# Patient Record
Sex: Female | Born: 1955 | Race: White | Hispanic: No | Marital: Married | Smoking: Former smoker
Health system: Southern US, Community
[De-identification: ages and names within clinical notes are randomized; demographics above are authoritative.]

## PROBLEM LIST (undated history)

## (undated) DIAGNOSIS — J449 Chronic obstructive pulmonary disease, unspecified: Secondary | ICD-10-CM

---

## 2016-08-23 ENCOUNTER — Inpatient Hospital Stay: Payer: BC Managed Care – PPO

## 2016-08-23 ENCOUNTER — Inpatient Hospital Stay
Admission: EM | Admit: 2016-08-23 | Discharge: 2016-08-26 | DRG: 871 | Disposition: A | Discharge status: Home / Self Care | Payer: BC Managed Care – PPO | Attending: Internal Medicine | Admitting: Internal Medicine

## 2016-08-23 ENCOUNTER — Emergency Department: Payer: BC Managed Care – PPO

## 2016-08-23 ENCOUNTER — Encounter: Payer: Self-pay | Admitting: Emergency Medicine

## 2016-08-23 DIAGNOSIS — J440 Chronic obstructive pulmonary disease with acute lower respiratory infection: Secondary | ICD-10-CM | POA: Diagnosis present

## 2016-08-23 DIAGNOSIS — R0602 Shortness of breath: Secondary | ICD-10-CM | POA: Diagnosis not present

## 2016-08-23 DIAGNOSIS — R531 Weakness: Secondary | ICD-10-CM | POA: Diagnosis present

## 2016-08-23 DIAGNOSIS — J9621 Acute and chronic respiratory failure with hypoxia: Secondary | ICD-10-CM | POA: Diagnosis present

## 2016-08-23 DIAGNOSIS — Z87891 Personal history of nicotine dependence: Secondary | ICD-10-CM

## 2016-08-23 DIAGNOSIS — J189 Pneumonia, unspecified organism: Secondary | ICD-10-CM | POA: Diagnosis present

## 2016-08-23 DIAGNOSIS — R748 Abnormal levels of other serum enzymes: Secondary | ICD-10-CM | POA: Diagnosis present

## 2016-08-23 DIAGNOSIS — Z681 Body mass index (BMI) 19 or less, adult: Secondary | ICD-10-CM

## 2016-08-23 DIAGNOSIS — E43 Unspecified severe protein-calorie malnutrition: Secondary | ICD-10-CM | POA: Diagnosis present

## 2016-08-23 DIAGNOSIS — J984 Other disorders of lung: Secondary | ICD-10-CM | POA: Diagnosis present

## 2016-08-23 DIAGNOSIS — F17200 Nicotine dependence, unspecified, uncomplicated: Secondary | ICD-10-CM | POA: Diagnosis present

## 2016-08-23 DIAGNOSIS — E876 Hypokalemia: Secondary | ICD-10-CM

## 2016-08-23 DIAGNOSIS — R911 Solitary pulmonary nodule: Secondary | ICD-10-CM | POA: Diagnosis present

## 2016-08-23 DIAGNOSIS — Y95 Nosocomial condition: Secondary | ICD-10-CM | POA: Diagnosis present

## 2016-08-23 DIAGNOSIS — D473 Essential (hemorrhagic) thrombocythemia: Secondary | ICD-10-CM | POA: Diagnosis present

## 2016-08-23 DIAGNOSIS — R0781 Pleurodynia: Secondary | ICD-10-CM

## 2016-08-23 DIAGNOSIS — I214 Non-ST elevation (NSTEMI) myocardial infarction: Secondary | ICD-10-CM

## 2016-08-23 DIAGNOSIS — A419 Sepsis, unspecified organism: Principal | ICD-10-CM | POA: Diagnosis present

## 2016-08-23 DIAGNOSIS — J439 Emphysema, unspecified: Secondary | ICD-10-CM

## 2016-08-23 DIAGNOSIS — J441 Chronic obstructive pulmonary disease with (acute) exacerbation: Secondary | ICD-10-CM | POA: Diagnosis present

## 2016-08-23 DIAGNOSIS — J181 Lobar pneumonia, unspecified organism: Secondary | ICD-10-CM

## 2016-08-23 DIAGNOSIS — D72829 Elevated white blood cell count, unspecified: Secondary | ICD-10-CM

## 2016-08-23 HISTORY — DX: Chronic obstructive pulmonary disease, unspecified: J44.9

## 2016-08-23 LAB — CBC WITH DIFFERENTIAL/PLATELET
BASOS ABS: 0.1 10*3/uL (ref 0–0.1)
BASOS PCT: 1 %
EOS ABS: 0 10*3/uL (ref 0–0.7)
EOS PCT: 0 %
HCT: 42.1 % (ref 35.0–47.0)
HEMOGLOBIN: 14.4 g/dL (ref 12.0–16.0)
Lymphocytes Relative: 5 %
Lymphs Abs: 0.8 10*3/uL — ABNORMAL LOW (ref 1.0–3.6)
MCH: 28.3 pg (ref 26.0–34.0)
MCHC: 34.2 g/dL (ref 32.0–36.0)
MCV: 82.8 fL (ref 80.0–100.0)
Monocytes Absolute: 0.7 10*3/uL (ref 0.2–0.9)
Monocytes Relative: 4 %
NEUTROS PCT: 90 %
Neutro Abs: 15.6 10*3/uL — ABNORMAL HIGH (ref 1.4–6.5)
PLATELETS: 708 10*3/uL — AB (ref 150–440)
RBC: 5.09 MIL/uL (ref 3.80–5.20)
RDW: 13.5 % (ref 11.5–14.5)
WBC: 17.1 10*3/uL — AB (ref 3.6–11.0)

## 2016-08-23 LAB — URINALYSIS, ROUTINE W REFLEX MICROSCOPIC
Bacteria, UA: NONE SEEN
Bilirubin Urine: NEGATIVE
GLUCOSE, UA: 50 mg/dL — AB
Ketones, ur: NEGATIVE mg/dL
Leukocytes, UA: NEGATIVE
NITRITE: NEGATIVE
PROTEIN: 30 mg/dL — AB
SPECIFIC GRAVITY, URINE: 1.017 (ref 1.005–1.030)
pH: 6 (ref 5.0–8.0)

## 2016-08-23 LAB — PROCALCITONIN: PROCALCITONIN: 0.13 ng/mL

## 2016-08-23 LAB — COMPREHENSIVE METABOLIC PANEL
ALBUMIN: 3 g/dL — AB (ref 3.5–5.0)
ALT: 17 U/L (ref 14–54)
ANION GAP: 15 (ref 5–15)
AST: 32 U/L (ref 15–41)
Alkaline Phosphatase: 175 U/L — ABNORMAL HIGH (ref 38–126)
BILIRUBIN TOTAL: 0.6 mg/dL (ref 0.3–1.2)
BUN: 12 mg/dL (ref 6–20)
CALCIUM: 9 mg/dL (ref 8.9–10.3)
CO2: 31 mmol/L (ref 22–32)
Chloride: 92 mmol/L — ABNORMAL LOW (ref 101–111)
Creatinine, Ser: 0.52 mg/dL (ref 0.44–1.00)
GFR calc non Af Amer: >60 mL/min (ref >60)
GLUCOSE: 143 mg/dL — AB (ref 65–99)
POTASSIUM: 2.8 mmol/L — AB (ref 3.5–5.1)
Sodium: 138 mmol/L (ref 135–145)
TOTAL PROTEIN: 8.2 g/dL — AB (ref 6.5–8.1)

## 2016-08-23 LAB — INFLUENZA PANEL BY PCR (TYPE A & B)
INFLBPCR: NEGATIVE
Influenza A By PCR: NEGATIVE

## 2016-08-23 LAB — LACTIC ACID, PLASMA
LACTIC ACID, VENOUS: 2.5 mmol/L — AB (ref 0.5–1.9)
Lactic Acid, Venous: 1.8 mmol/L (ref 0.5–1.9)

## 2016-08-23 LAB — POTASSIUM: Potassium: 3.4 mmol/L — ABNORMAL LOW (ref 3.5–5.1)

## 2016-08-23 LAB — TROPONIN I: TROPONIN I: 0.17 ng/mL — AB (ref <0.03)

## 2016-08-23 LAB — BRAIN NATRIURETIC PEPTIDE: B Natriuretic Peptide: 572 pg/mL — ABNORMAL HIGH (ref 0.0–100.0)

## 2016-08-23 LAB — PROTIME-INR
INR: 1.04
PROTHROMBIN TIME: 13.6 s (ref 11.4–15.2)

## 2016-08-23 LAB — MAGNESIUM: MAGNESIUM: 2.1 mg/dL (ref 1.7–2.4)

## 2016-08-23 LAB — MRSA PCR SCREENING: MRSA BY PCR: NEGATIVE

## 2016-08-23 LAB — LIPASE, BLOOD: LIPASE: 23 U/L (ref 11–51)

## 2016-08-23 MED ORDER — ALBUTEROL SULFATE (2.5 MG/3ML) 0.083% IN NEBU
2.5000 mg | INHALATION_SOLUTION | RESPIRATORY_TRACT | Status: DC | PRN
  Administered 2016-08-25: 2.5 mg via RESPIRATORY_TRACT
  Filled 2016-08-23: qty 3

## 2016-08-23 MED ORDER — ONDANSETRON HCL 4 MG/2ML IJ SOLN
4.0000 mg | Freq: Four times a day (QID) | INTRAMUSCULAR | Status: DC | PRN
Start: 2016-08-23 — End: 2016-08-26

## 2016-08-23 MED ORDER — VANCOMYCIN HCL 500 MG IV SOLR
500.0000 mg | INTRAVENOUS | Status: DC
  Administered 2016-08-24: 500 mg via INTRAVENOUS
  Filled 2016-08-23: qty 500

## 2016-08-23 MED ORDER — SODIUM CHLORIDE 0.9 % IV BOLUS (SEPSIS)
250.0000 mL | Freq: Once | INTRAVENOUS | Status: AC
  Administered 2016-08-23: 250 mL via INTRAVENOUS

## 2016-08-23 MED ORDER — METHYLPREDNISOLONE SODIUM SUCC 40 MG IJ SOLR
40.0000 mg | Freq: Three times a day (TID) | INTRAMUSCULAR | Status: DC
  Administered 2016-08-23 – 2016-08-26 (×10): 40 mg via INTRAVENOUS
  Filled 2016-08-23 (×10): qty 1

## 2016-08-23 MED ORDER — ACETAMINOPHEN 325 MG PO TABS
650.0000 mg | ORAL_TABLET | Freq: Four times a day (QID) | ORAL | Status: DC | PRN
Start: 2016-08-23 — End: 2016-08-26
  Administered 2016-08-25: 650 mg via ORAL
  Filled 2016-08-23: qty 2

## 2016-08-23 MED ORDER — SODIUM CHLORIDE 0.9 % IV BOLUS (SEPSIS)
1000.0000 mL | Freq: Once | INTRAVENOUS | Status: AC
  Administered 2016-08-23: 1000 mL via INTRAVENOUS

## 2016-08-23 MED ORDER — DOCUSATE SODIUM 100 MG PO CAPS: 100.0000 mg | ORAL_CAPSULE | Freq: Two times a day (BID) | ORAL | Status: DC | PRN

## 2016-08-23 MED ORDER — ENOXAPARIN SODIUM 40 MG/0.4ML ~~LOC~~ SOLN
40.0000 mg | SUBCUTANEOUS | Status: DC
  Administered 2016-08-23: 40 mg via SUBCUTANEOUS
  Filled 2016-08-23: qty 0.4

## 2016-08-23 MED ORDER — ACETAMINOPHEN 650 MG RE SUPP: 650.0000 mg | Freq: Four times a day (QID) | RECTAL | Status: DC | PRN

## 2016-08-23 MED ORDER — POTASSIUM CHLORIDE CRYS ER 20 MEQ PO TBCR
40.0000 meq | EXTENDED_RELEASE_TABLET | ORAL | Status: AC
  Administered 2016-08-23 (×2): 40 meq via ORAL
  Filled 2016-08-23 (×2): qty 2

## 2016-08-23 MED ORDER — DEXTROSE 5 % IV SOLN: 1.0000 g | Freq: Once | INTRAVENOUS | Status: DC

## 2016-08-23 MED ORDER — METHYLPREDNISOLONE SODIUM SUCC 125 MG IJ SOLR
125.0000 mg | Freq: Once | INTRAMUSCULAR | Status: AC
  Administered 2016-08-23: 125 mg via INTRAVENOUS
  Filled 2016-08-23: qty 2

## 2016-08-23 MED ORDER — PIPERACILLIN-TAZOBACTAM 3.375 G IVPB
3.3750 g | Freq: Three times a day (TID) | INTRAVENOUS | Status: DC
  Administered 2016-08-23 – 2016-08-24 (×2): 3.375 g via INTRAVENOUS
  Filled 2016-08-23 (×2): qty 50

## 2016-08-23 MED ORDER — PIPERACILLIN-TAZOBACTAM 3.375 G IVPB 30 MIN
3.3750 g | INTRAVENOUS | Status: AC
  Administered 2016-08-23: 3.375 g via INTRAVENOUS
  Filled 2016-08-23: qty 50

## 2016-08-23 MED ORDER — IPRATROPIUM-ALBUTEROL 0.5-2.5 (3) MG/3ML IN SOLN
3.0000 mL | Freq: Four times a day (QID) | RESPIRATORY_TRACT | Status: DC
  Administered 2016-08-23 – 2016-08-26 (×9): 3 mL via RESPIRATORY_TRACT
  Filled 2016-08-23 (×10): qty 3

## 2016-08-23 MED ORDER — VANCOMYCIN HCL IN DEXTROSE 750-5 MG/150ML-% IV SOLN
750.0000 mg | Freq: Once | INTRAVENOUS | Status: AC
  Administered 2016-08-23: 750 mg via INTRAVENOUS
  Filled 2016-08-23: qty 150

## 2016-08-23 MED ORDER — IOPAMIDOL (ISOVUE-300) INJECTION 61%
75.0000 mL | Freq: Once | INTRAVENOUS | Status: AC | PRN
  Administered 2016-08-23: 75 mL via INTRAVENOUS

## 2016-08-23 MED ORDER — SODIUM CHLORIDE 0.9% FLUSH
3.0000 mL | Freq: Two times a day (BID) | INTRAVENOUS | Status: DC
Start: 2016-08-23 — End: 2016-08-26
  Administered 2016-08-23 – 2016-08-26 (×6): 3 mL via INTRAVENOUS

## 2016-08-23 MED ORDER — IPRATROPIUM-ALBUTEROL 0.5-2.5 (3) MG/3ML IN SOLN
RESPIRATORY_TRACT | Status: AC
  Administered 2016-08-23: 3 mL via RESPIRATORY_TRACT
  Filled 2016-08-23: qty 3

## 2016-08-23 MED ORDER — ONDANSETRON HCL 4 MG PO TABS: 4.0000 mg | ORAL_TABLET | Freq: Four times a day (QID) | ORAL | Status: DC | PRN

## 2016-08-23 MED ORDER — SODIUM CHLORIDE 0.9 % IV BOLUS (SEPSIS)
500.0000 mL | Freq: Once | INTRAVENOUS | Status: AC
  Administered 2016-08-23: 500 mL via INTRAVENOUS

## 2016-08-23 MED ORDER — IPRATROPIUM-ALBUTEROL 0.5-2.5 (3) MG/3ML IN SOLN
3.0000 mL | Freq: Once | RESPIRATORY_TRACT | Status: AC
  Administered 2016-08-23: 3 mL via RESPIRATORY_TRACT
  Filled 2016-08-23: qty 3

## 2016-08-23 MED ORDER — CEFTRIAXONE SODIUM-DEXTROSE 1-3.74 GM-% IV SOLR
1.0000 g | Freq: Once | INTRAVENOUS | Status: AC
  Administered 2016-08-23: 1 g via INTRAVENOUS
  Filled 2016-08-23: qty 50

## 2016-08-23 MED ORDER — DEXTROSE 5 % IV SOLN
500.0000 mg | Freq: Once | INTRAVENOUS | Status: AC
  Administered 2016-08-23: 500 mg via INTRAVENOUS
  Filled 2016-08-23: qty 500

## 2016-08-23 NOTE — Progress Notes (Signed)
Pharmacy Antibiotic Note  Samantha Ho is a 61 y.o. female admitted on 08/23/2016 with  sepsis secondary to HCAP.  Pharmacy has been consulted for Vancomycin dosing. Patient received Ceftriaxone 1gm and Azithromycin 500mg  IV x 1 dose in ED. Patient is also receiving Zosyn 3.375 IV EI every 8 hours.   Plan: Ke: 0.040   Vd: 25.2   T1/2: 17  Will give patient Vancomycin 750mg  IV x 1 dose followed by vancomycin 500mg  IV every 24 hours with 14 hour stack dosing. Trough ordered prior to 4th dose. Calculated trough at Css is 15. Will monitor renal function daily and adjust dose as needed.  MRSA PCR ordered- if negative, recommend discontinuation of vancomycin.   Height: 5\' 2"  (157.5 cm) Weight: 79 lb 5.9 oz (36 kg) IBW/kg (Calculated) : 50.1  Temp (24hrs), Avg:100 F (37.8 C), Min:100 F (37.8 C), Max:100 F (37.8 C)   Recent Labs Lab 08/23/16 1308  WBC 17.1*  CREATININE 0.52  LATICACIDVEN 2.5*    Estimated Creatinine Clearance: 42.5 mL/min (by C-G formula based on SCr of 0.52 mg/dL).    No Known Allergies  Antimicrobials this admission: 3/1 ceftriaxone/Azithromycin >> x 1 dose 3/1 Vancomycin >>  3/1 Zosyn>>  Dose adjustments this admission:  Microbiology results: 3/1 BCx: pending  3/1 UCx: pending 3/1 MRSA PCR: pending  Thank you for allowing pharmacy to be a part of this patient's care.  Pernell Dupre, PharmD, BCPS Clinical Pharmacist 08/23/2016 4:16 PM

## 2016-08-23 NOTE — ED Provider Notes (Signed)
[facility] Emergency Department Provider Note  ____________________________________________   First MD Initiated Contact with Patient 08/23/16 1300     (approximate)  I have reviewed the triage vital signs and the nursing notes.   HISTORY  Chief Complaint Chest Pain and Shortness of Breath  History obtained from the patient's husband as the patient arrives gasping for air  HPI Samantha Ho is a 61 y.o. female comes to the emergency Department with roughly 1 week of increasing shortness of breath. She has no known past medical history because she has not seen a physician in over 40 years. She is a heavy smoker.About a week ago secondary to increasing shortness of breath she went to primary care physician for the first time who prescribed her an unknown antibiotic. Her husband said she initially did better but this morning woke up with chest pain and unable to get up out of bed. Her first lifetime flu shot about 3 weeks ago. Further history is limited by the patient's clinical condition.   Past Medical History:  Diagnosis Date  . COPD (chronic obstructive pulmonary disease) (HCC)     There are no active problems to display for this patient.   History reviewed. No pertinent surgical history.  Prior to Admission medications   Medication Sig Start Date End Date Taking? Authorizing Provider  Aspirin-Acetaminophen-Caffeine (GOODY HEADACHE PO) Take 1 packet by mouth as needed (headache).   Yes Historical Provider, MD  ipratropium (ATROVENT HFA) 17 MCG/ACT inhaler Inhale 2 puffs into the lungs every 6 (six) hours as needed for wheezing.   Yes Historical Provider, MD    Allergies Patient has no known allergies.  No family history on file.  Social History Social History  Substance Use Topics  . Smoking status: Former Smoker    Types: Cigarettes    Quit date: 08/09/2016  . Smokeless tobacco: Never Used  . Alcohol use No    Review of Systems Level V  exemption further history limited by the patient's clinical condition 10-point ROS otherwise negative.  ____________________________________________   PHYSICAL EXAM:  VITAL SIGNS: ED Triage Vitals  Enc Vitals Group     BP      Pulse      Resp      Temp      Temp src      SpO2      Weight      Height      Head Circumference      Peak Flow      Pain Score      Pain Loc      Pain Edu?      Excl. in [place]?     Constitutional: Severe respiratory distress using accessory muscles slight diaphoresis speaking in short gasps cachectic Eyes: PERRL EOMI. Head: Atraumatic. Nose: No congestion/rhinnorhea. Mouth/Throat: No trismus Neck: No stridor.   Cardiovascular: Tachycardic rate, regular rhythm. Grossly normal heart sounds.  Good peripheral circulation. Respiratory: Severe respiratory distress using accessory muscles moving limited amounts of air Gastrointestinal: Soft nondistended nontender no rebound no guarding no peritonitis  Musculoskeletal: No lower extremity edema  legs are equal in size Neurologic:. No gross focal neurologic deficits are appreciated. Skin:  Skin is warm, dry and intact. No rash noted. Psychiatric: Severe distress  ____________________________________________   LABS (all labs ordered are listed, but only abnormal results are displayed)  Labs Reviewed  LACTIC ACID, PLASMA - Abnormal; Notable for the following:       Result Value   Lactic  Acid, Venous 2.5 (*)    All other components within normal limits  COMPREHENSIVE METABOLIC PANEL - Abnormal; Notable for the following:    Potassium 2.8 (*)    Chloride 92 (*)    Glucose, Bld 143 (*)    Total Protein 8.2 (*)    Albumin 3.0 (*)    Alkaline Phosphatase 175 (*)    All other components within normal limits  TROPONIN I - Abnormal; Notable for the following:    Troponin I 0.17 (*)    All other components within normal limits  CBC WITH DIFFERENTIAL/PLATELET - Abnormal; Notable for the following:    WBC  17.1 (*)    Platelets 708 (*)    Neutro Abs 15.6 (*)    Lymphs Abs 0.8 (*)    All other components within normal limits  URINALYSIS, ROUTINE W REFLEX MICROSCOPIC - Abnormal; Notable for the following:    Color, Urine YELLOW (*)    APPearance CLEAR (*)    Glucose, UA 50 (*)    Hgb urine dipstick MODERATE (*)    Protein, ur 30 (*)    Squamous Epithelial / LPF 0-5 (*)    All other components within normal limits  BLOOD GAS, VENOUS - Abnormal; Notable for the following:    pH, Ven 7.44 (*)    Bicarbonate 36.7 (*)    Acid-Base Excess 10.5 (*)    All other components within normal limits  CULTURE, BLOOD (ROUTINE X 2)  CULTURE, BLOOD (ROUTINE X 2)  URINE CULTURE  LIPASE, BLOOD  PROTIME-INR  LACTIC ACID, PLASMA  PROCALCITONIN  BRAIN NATRIURETIC PEPTIDE  INFLUENZA PANEL BY PCR (TYPE A & B)   ____________________________________________  EKG  ED ECG REPORT I, Darel Hong, the attending physician, personally viewed and interpreted this ECG.  Date: 08/23/2016 Rate: 103 Rhythm: Tachycardia sinus rhythm QRS Axis: normal Intervals: normal ST/T Wave abnormalities: normal Conduction Disturbances: none Narrative Interpretation: unremarkable  ____________________________________________  RADIOLOGY Chest x-ray appears to show multilobar pneumonia along with COPD ____________________________________________   PROCEDURES  Procedure(s) performed: no  Procedures  Critical Care performed: yes  CRITICAL CARE Performed by: Darel Hong   Total critical care time: 35 minutes  Critical care time was exclusive of separately billable procedures and treating other patients.  Critical care was necessary to treat or prevent imminent or life-threatening deterioration.  Critical care was time spent personally by me on the following activities: development of treatment plan with patient and/or surrogate as well as nursing, discussions with consultants, evaluation of patient's  response to treatment, examination of patient, obtaining history from patient or surrogate, ordering and performing treatments and interventions, ordering and review of laboratory studies, ordering and review of radiographic studies, pulse oximetry and re-evaluation of patient's condition.   ____________________________________________   INITIAL IMPRESSION / ASSESSMENT AND PLAN / ED COURSE  Pertinent labs & imaging results that were available during my care of the patient were reviewed by me and considered in my medical decision making (see chart for details). On arrival the patient is in severe respiratory distress saturating low 80s on room air but perking up with a facemask. She is moving limited amounts of air and likely has COPD. I will give to duo nebs while waiting for her septic workup.  Chest x-ray shows chronic changes with superimposed pneumonia versus scar. Ceftriaxone and azithromycin for community associated pneumonia and the CT chest is pending. I discussed the case with the on-call hospitalist who is graciously agreed to admit the patient to her  service. The patient is now breathing more comfortably on 2 L nasal cannula.     30 cc/kg is 1080 mL of fluid. ____________________________________________   FINAL CLINICAL IMPRESSION(S) / ED DIAGNOSES  Final diagnoses:  Community acquired pneumonia of left upper lobe of lung ([place])  NSTEMI (non-ST elevated myocardial infarction) ([place])      NEW MEDICATIONS STARTED DURING THIS VISIT:  New Prescriptions   No medications on file     Note:  This document was prepared using Dragon voice recognition software and may include unintentional dictation errors.     Darel Hong, MD 08/23/16 1415

## 2016-08-23 NOTE — ED Triage Notes (Signed)
Pt in via POV with complaints of chest pain and shortness of breath, pt room air sat 75%, MD notified and to bedside at this time.  Pt husband reports recently treated for "lung infection" and has finished antibiotics.  Pt having difficulty speaking upon assessment.  NRB mask placed; 100% saturation at this time.

## 2016-08-23 NOTE — Progress Notes (Signed)
Patient was admitted to room 149 from ED. O2 at 2L, sats 98%. A&O x4. Husband at bedside. Oriented to room, call light, TV and bed controls. Tele placed. Reviewed POC and orders.

## 2016-08-23 NOTE — H&P (Signed)
[facility] [place] NAME: Samantha Ho    MR#:  [id number]  DATE OF BIRTH:  11-12-1955  DATE OF ADMISSION:  08/23/2016  PRIMARY CARE PHYSICIAN: Pcp Not In System   REQUESTING/REFERRING PHYSICIAN: Darel Hong, MD  CHIEF COMPLAINT:   SOB HISTORY OF PRESENT ILLNESS:  Samantha Ho  is a 61 y.o. female with a known history of COPD but quit smoking was not feeling good for approximately 2 weeks. Patient was reporting cough and worsening of shortness of breath. Patient has completed an antibiotic course for 10 days, approximately 5 days ago, she temporarily felt better but then again she started feeling worse. Patient used to be a heavy smoker but quit smoking. Today she was extremely short of breath and brought into the emergency department. Patient was given IV Solu-Medrol and the chest x-ray has revealed a pneumonia. CT chest is ordered. IV Rocephin and azithromycin was started in the ED and hospitalist team is called to admit the patient. Patient's shortness of breath significantly improved during my examination.  PAST MEDICAL HISTORY:   Past Medical History:  Diagnosis Date  . COPD (chronic obstructive pulmonary disease) ([place])     PAST SURGICAL HISTOIRY:  History reviewed. No pertinent surgical history.  SOCIAL HISTORY:   Social History  Substance Use Topics  . Smoking status: Former Smoker    Types: Cigarettes    Quit date: 08/09/2016  . Smokeless tobacco: Never Used  . Alcohol use No    FAMILY HISTORY:  Reviewed family history. Denies any diabetes or hypertension running in her family denies any cancer seizure  DRUG ALLERGIES:  No Known Allergies  REVIEW OF SYSTEMS:  CONSTITUTIONAL: No fever, fatigue or weakness.  EYES: No blurred or double vision.  EARS, NOSE, AND THROAT: No tinnitus or ear pain.  RESPIRATORY:  Patient is reporting cough, shortness of breath, wheezing , denies hemoptysis.  CARDIOVASCULAR: No  chest pain, orthopnea, edema.  GASTROINTESTINAL: No nausea, vomiting, diarrhea or abdominal pain.  GENITOURINARY: No dysuria, hematuria.  ENDOCRINE: No polyuria, nocturia,  HEMATOLOGY: No anemia, easy bruising or bleeding SKIN: No rash or lesion. MUSCULOSKELETAL: No joint pain or arthritis.   NEUROLOGIC: No tingling, numbness, weakness.  PSYCHIATRY: No anxiety or depression.   MEDICATIONS AT HOME:   Prior to Admission medications   Medication Sig Start Date End Date Taking? Authorizing Provider  Aspirin-Acetaminophen-Caffeine (GOODY HEADACHE PO) Take 1 packet by mouth as needed (headache).   Yes Historical Provider, MD  ipratropium (ATROVENT HFA) 17 MCG/ACT inhaler Inhale 2 puffs into the lungs every 6 (six) hours as needed for wheezing.   Yes Historical Provider, MD      VITAL SIGNS:  Blood pressure (!) 157/88, pulse 89, temperature 100 F (37.8 C), temperature source Axillary, resp. rate (!) 23, height 5\' 2"  (1.575 m), weight 36 kg (79 lb 5.9 oz), SpO2 98 %.  PHYSICAL EXAMINATION:  GENERAL:  61 y.o.-year-old patient lying in the bed with no acute distress.  EYES: Pupils equal, round, reactive to light and accommodation. No scleral icterus. Extraocular muscles intact.  HEENT: Head atraumatic, normocephalic. Oropharynx and nasopharynx clear.  NECK:  Supple, no jugular venous distention. No thyroid enlargement, no tenderness.  LUNGS: Diminished breath sounds bilaterally, no wheezing, rales,rhonchi or crepitation. positive crackles No use of accessory muscles of respiration.  CARDIOVASCULAR: S1, S2 normal. No murmurs, rubs, or gallops.  ABDOMEN: Soft, nontender, nondistended. Bowel sounds present. No organomegaly or mass.  EXTREMITIES: No pedal edema, cyanosis,  or clubbing.  NEUROLOGIC: Cranial nerves II through XII are intact. Muscle strength 5/5 in all extremities. Sensation intact. Gait not checked.  PSYCHIATRIC: The patient is alert and oriented x 3.  SKIN: No obvious rash,  lesion, or ulcer.   LABORATORY PANEL:   CBC  Recent Labs Lab 08/23/16 1308  WBC 17.1*  HGB 14.4  HCT 42.1  PLT 708*   ------------------------------------------------------------------------------------------------------------------  Chemistries   Recent Labs Lab 08/23/16 1308  NA 138  K 2.8*  CL 92*  CO2 31  GLUCOSE 143*  BUN 12  CREATININE 0.52  CALCIUM 9.0  AST 32  ALT 17  ALKPHOS 175*  BILITOT 0.6   ------------------------------------------------------------------------------------------------------------------  Cardiac Enzymes  Recent Labs Lab 08/23/16 1308  TROPONINI 0.17*   ------------------------------------------------------------------------------------------------------------------  RADIOLOGY:  Dg Chest Port 1 View  Result Date: 08/23/2016 CLINICAL DATA:  Chest pain and shortness of breath EXAM: PORTABLE CHEST 1 VIEW COMPARISON:  None. FINDINGS: There is patchy airspace opacity throughout the left upper lobe with volume loss. There is perihilar opacity on the right. 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 Lungs overall are hyperexpanded. Heart size is normal. Pulmonary vascularity appears somewhat distorted on both sides. There is questionable adenopathy on the right. No evident bone lesions. IMPRESSION: Upper lobe opacity on the left with apparent degree of retraction in the left upper lobe. Question whether all of the opacity in the left upper lobe represent scarring versus scarring with pneumonia. There is opacity in the right perihilar region with questionable hilar adenopathy on the right. There is underlying emphysematous change with probable scarring in the bases. As there are no prior studies to compare and there is question of adenopathy in the right hilar region, correlation with chest CT, ideally with intravenous contrast, is felt to be advisable for further assessment. Electronically Signed   By:  Lowella Grip III M.D.   On: 08/23/2016 13:40    EKG:   Orders placed or performed during the hospital encounter of 08/23/16  . EKG 12-Lead  . EKG 12-Lead  . ED EKG 12-Lead  . ED EKG 12-Lead    IMPRESSION AND PLAN:   Samantha Ho  is a 61 y.o. female with a known history of COPD but quit smoking was not feeling good for approximately 2 weeks. Patient was reporting cough and worsening of shortness of breath. Patient has completed an antibiotic course for 10 days, approximately 5 days ago, she temporarily felt better but then again she started feeling worse. Patient used to be a heavy smoker but quit smoking. Today she was extremely short of breath and brought into the emergency department. Patient was given IV Solu-Medrol and the chest x-ray has revealed a pneumonia  # sepsis-healthcare associated pneumonia Meets septic criteria with leukocytosis and elevated lactic acid. Patient was just recently treated with antibiotic the name of which she could not recall. We will treat her with Zosyn and vancomycin for healthcare associated pneumonia Nebulizer treatments Sputum culture and sensitivity Follow lactic acid levels IV fluids and supportive treatment  #COPD exacerbation Patient quit smoking Solu-Medrol 127 mg IV was given in the emergency department will continue Solu-Medrol for 20 g IV every 8 hours Duoneb nebulizer treatments every 6 hours and albuterol as needed  #Hypokalemia replete potassium Recheck potassium and magnesium today evening  #Elevated troponin could be from supply demand ischemia Patient is asymptomatic denies any chest pain. Cycle troponins    All the records are reviewed and case discussed  with ED provider. Management plans discussed with the patient, family and they are in agreement.  CODE STATUS: FC , husband is the healthcare power of attorney  TOTAL TIME TAKING CARE OF THIS PATIENT: 45  minutes.   Note: This dictation was prepared with Dragon  dictation along with smaller phrase technology. Any transcriptional errors that result from this process are unintentional.  Nicholes Mango M.D on 08/23/2016 at 2:59 PM  Between 7am to 6pm - Pager - [phone]  After 6pm go to [email or web address] - password [email or web address] [facility]  Office  [phone]  CC: Primary care physician; Pcp Not In System

## 2016-08-23 NOTE — ED Notes (Signed)
Dr. Mable Paris in room to update patient and family and discuss plan of care.

## 2016-08-24 LAB — BASIC METABOLIC PANEL
Anion gap: 11 (ref 5–15)
BUN: 13 mg/dL (ref 6–20)
CHLORIDE: 98 mmol/L — AB (ref 101–111)
CO2: 29 mmol/L (ref 22–32)
CREATININE: 0.55 mg/dL (ref 0.44–1.00)
Calcium: 8.6 mg/dL — ABNORMAL LOW (ref 8.9–10.3)
Glucose, Bld: 148 mg/dL — ABNORMAL HIGH (ref 65–99)
POTASSIUM: 4.4 mmol/L (ref 3.5–5.1)
SODIUM: 138 mmol/L (ref 135–145)

## 2016-08-24 LAB — CBC
HCT: 35.5 % (ref 35.0–47.0)
HEMOGLOBIN: 11.7 g/dL — AB (ref 12.0–16.0)
MCH: 27.9 pg (ref 26.0–34.0)
MCHC: 32.9 g/dL (ref 32.0–36.0)
MCV: 84.6 fL (ref 80.0–100.0)
Platelets: 497 10*3/uL — ABNORMAL HIGH (ref 150–440)
RBC: 4.2 MIL/uL (ref 3.80–5.20)
RDW: 13 % (ref 11.5–14.5)
WBC: 4.7 10*3/uL (ref 3.6–11.0)

## 2016-08-24 LAB — URINE CULTURE: CULTURE: NO GROWTH

## 2016-08-24 MED ORDER — AMOXICILLIN-POT CLAVULANATE 875-125 MG PO TABS
1.0000 | ORAL_TABLET | Freq: Two times a day (BID) | ORAL | Status: DC
  Administered 2016-08-24 – 2016-08-26 (×5): 1 via ORAL
  Filled 2016-08-24 (×5): qty 1

## 2016-08-24 MED ORDER — HYDROCOD POLST-CPM POLST ER 10-8 MG/5ML PO SUER
5.0000 mL | Freq: Two times a day (BID) | ORAL | Status: DC
  Administered 2016-08-24 – 2016-08-26 (×5): 5 mL via ORAL
  Filled 2016-08-24 (×5): qty 5

## 2016-08-24 MED ORDER — TRAMADOL HCL 50 MG PO TABS: 50.0000 mg | ORAL_TABLET | Freq: Four times a day (QID) | ORAL | Status: DC | PRN

## 2016-08-24 MED ORDER — BENZONATATE 100 MG PO CAPS
100.0000 mg | ORAL_CAPSULE | Freq: Three times a day (TID) | ORAL | Status: DC
  Administered 2016-08-24 – 2016-08-26 (×7): 100 mg via ORAL
  Filled 2016-08-24 (×7): qty 1

## 2016-08-24 MED ORDER — ENOXAPARIN SODIUM 30 MG/0.3ML ~~LOC~~ SOLN
30.0000 mg | SUBCUTANEOUS | Status: DC
  Administered 2016-08-24 – 2016-08-25 (×2): 30 mg via SUBCUTANEOUS
  Filled 2016-08-24 (×2): qty 0.3

## 2016-08-24 NOTE — Care Management (Signed)
Requested primary nurse attempted to wean O2 but patient desats with minimal exertion. She will attempt tomorrow.  Met with patient at bedside. She states she has smoked for 45 years now quit. Lives at home with her spouse. Uses no DME or home O2.  Was driving up to 3 weeks ago when she first developed the infection. Failed outpatient treatment. Open to home health and home O2 if needed. PCP is Jacqualine Mau in [place]. Follow progression. Will need qualifying O2 sats

## 2016-08-24 NOTE — Consult Note (Signed)
Pulmonary Critical Care  Initial Consult Note   Samantha Ho [id number] DOB: [birth year] DOA: 08/23/2016  Referring physician: Gladstone Lighter, MD PCP: Pcp Not In System   Chief Complaint: SOB  HPI: Samantha Ho is a 61 y.o. female with history of COPD. Patient has been a smoker not feeling well for the last 2 weeks prior to admission. Patient has had increased cough noted. +chest pain at this time. Patient has had sputum no hemoptysis. Patient has had no fever noted at this time. A CT scan of the chest was ordered and this reveals severe Bullous Emphysema along with presence of pneumonia. Patient alos had a cavitary lesion noted likely an infected bulla rather than neoplasm based on the CT report. Patient however does have long standing history of tobacco use so this will warrant further workup and evaluation as an outpatient. She is currently on augmentin and she has also been started on steroids as well as nebs for the COPD.   Review of Systems:  Constitutional:  No weight loss, night sweats, +fatigue.  HEENT:  No headaches, nasal congestion, post nasal drip,  Cardio-vascular:  +chest pain, no anasarca, dizziness, palpitations  GI:  No heartburn, indigestion, abdominal pain, nausea, vomiting, diarrhea  Resp:  +shortness of breath. No coughing up of blood .+wheezing Skin:  no rash or lesions.  Musculoskeletal:  No joint pain or swelling.   Remainder ROS performed and is unremarkable other than noted in HPI  Past Medical History:  Diagnosis Date  . COPD (chronic obstructive pulmonary disease) ([place])    History reviewed. No pertinent surgical history. Social History:  reports that she quit smoking about 2 weeks ago. Her smoking use included Cigarettes. She smoked 3.00 packs per day. She has never used smokeless tobacco. She reports that she does not drink alcohol or use drugs.  No Known Allergies  History reviewed. No pertinent family history.  Prior to Admission  medications   Medication Sig Start Date End Date Taking? Authorizing Provider  Aspirin-Acetaminophen-Caffeine (GOODY HEADACHE PO) Take 1 packet by mouth as needed (headache).   Yes Historical Provider, MD  ipratropium (ATROVENT HFA) 17 MCG/ACT inhaler Inhale 2 puffs into the lungs every 6 (six) hours as needed for wheezing.   Yes Historical Provider, MD   Physical Exam: Vitals:   08/24/16 0754 08/24/16 0830 08/24/16 1141 08/24/16 1456  BP:  130/81 130/71   Pulse:  79 84   Resp:  18 16   Temp:  98.7 F (37.1 C) 98.5 F (36.9 C)   TempSrc:  Oral Oral   SpO2: 99% 96% 97% 97%  Weight:      Height:        Wt Readings from Last 3 Encounters:  08/23/16 37.2 kg (82 lb)    General:  Appears calm and comfortable Eyes: PERRL, normal lids, irises & conjunctiva ENT: grossly normal hearing, lips & tongue Neck: no LAD, masses or thyromegaly Cardiovascular: RRR, no m/r/g. Respiratory: +Rhonchi. Normal respiratory effort. Abdomen: soft, nontender Skin: no rash or induration seen on limited exam Musculoskeletal: grossly normal tone BUE/BLE Psychiatric: grossly normal mood and affect Neurologic: grossly non-focal.          Labs on Admission:  Basic Metabolic Panel:  Recent Labs Lab 08/23/16 1308 08/23/16 1954 08/24/16 0356  NA 138  --  138  K 2.8* 3.4* 4.4  CL 92*  --  98*  CO2 31  --  29  GLUCOSE 143*  --  148*  BUN 12  --  13  CREATININE 0.52  --  0.55  CALCIUM 9.0  --  8.6*  MG 2.1  --   --    Liver Function Tests:  Recent Labs Lab 08/23/16 1308  AST 32  ALT 17  ALKPHOS 175*  BILITOT 0.6  PROT 8.2*  ALBUMIN 3.0*    Recent Labs Lab 08/23/16 1308  LIPASE 23   No results for input(s): AMMONIA in the last 168 hours. CBC:  Recent Labs Lab 08/23/16 1308 08/24/16 0356  WBC 17.1* 4.7  NEUTROABS 15.6*  --   HGB 14.4 11.7*  HCT 42.1 35.5  MCV 82.8 84.6  PLT 708* 497*   Cardiac Enzymes:  Recent Labs Lab 08/23/16 1308  TROPONINI 0.17*    BNP (last 3  results)  Recent Labs  08/23/16 1310  BNP 572.0*    ProBNP (last 3 results) No results for input(s): PROBNP in the last 8760 hours.  CBG: No results for input(s): GLUCAP in the last 168 hours.  Radiological Exams on Admission: Ct Chest W Contrast  Result Date: 08/23/2016 CLINICAL DATA:  Chest pain and shortness of breath. Decreased O2 sats. EXAM: CT CHEST WITH CONTRAST TECHNIQUE: Multidetector CT imaging of the chest was performed during intravenous contrast administration. CONTRAST:  31mL ISOVUE-300 IOPAMIDOL (ISOVUE-300) INJECTION 61% COMPARISON:  Chest x-ray from earlier today. FINDINGS: Cardiovascular: Heart size normal. Coronary artery calcification is noted. Atherosclerotic calcification is noted in the wall of the thoracic aorta. Mediastinum/Nodes: No mediastinal lymphadenopathy. Confluent soft tissue attenuation tracks into the inferior right hilum and superior aspect of the left hilum. The esophagus has normal imaging features. There is no axillary lymphadenopathy. 7 mm left thyroid nodule. Lungs/Pleura: Marked emphysema with upper lobe bullous change noted bilaterally. This is associated with extensive architectural distortion, areas of scarring, and bronchiectasis. Areas of irregular pleural thickening and thick walled cavitary/ bullous lesions are identified in both lung apices, left greater than right. There is associated right lower lobe collapse/ consolidation with areas of traction bronchiectasis. Posteriorly on the left, there is an air cavity which is probably a large bulla although a chronic thick walled pneumothorax, potentially from bronchopleural fistula cannot be entirely excluded. Upper Abdomen: 11 mm hypoattenuating lesion in the lateral segment of the left liver may represent a cyst. Musculoskeletal: Bone windows reveal no worrisome lytic or sclerotic osseous lesions. IMPRESSION: 1. Marked changes of emphysema with architectural distortion/scarring and bullous change in the  upper lungs. 2. Multiple areas of confluent airspace consolidation bilaterally suggests multifocal pneumonia. 3. Large posterior thick walled irregular air cavity on the left may represent a chronic infected bulla or cystic cavity. This lesion is more elongated than typically seen for cavitary neoplasm of the neoplasm not excluded. Chronic pneumothorax secondary to broncho pleural fistula is a possibility 4. Airspace consolidation with a thick walled parenchymal cavitary lesion right apex. Infection/inflammation is likely etiology. Neoplasm cannot be excluded. 5. Right lower lobe dense collapse/consolidation with areas of cystic change. There is a similar, but much smaller thick walled air-filled posterior cavity associated with this disease and it is unclear whether this represents a pleural based abnormality or posterior parenchymal air cavity. 6. Posterior left lower lobe airspace disease with substantial left lower lobe bullous change. Electronically Signed   By: Misty Stanley M.D.   On: 08/23/2016 16:14   Dg Chest Port 1 View  Result Date: 08/23/2016 CLINICAL DATA:  Chest pain and shortness of breath EXAM: PORTABLE CHEST 1 VIEW COMPARISON:  None. FINDINGS: There is patchy airspace opacity throughout  the left upper lobe with volume loss. There is perihilar opacity on the right. 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 Lungs overall are hyperexpanded. Heart size is normal. Pulmonary vascularity appears somewhat distorted on both sides. There is questionable adenopathy on the right. No evident bone lesions. IMPRESSION: Upper lobe opacity on the left with apparent degree of retraction in the left upper lobe. Question whether all of the opacity in the left upper lobe represent scarring versus scarring with pneumonia. There is opacity in the right perihilar region with questionable hilar adenopathy on the right. There is underlying emphysematous change with probable  scarring in the bases. As there are no prior studies to compare and there is question of adenopathy in the right hilar region, correlation with chest CT, ideally with intravenous contrast, is felt to be advisable for further assessment. Electronically Signed   By: Lowella Grip III M.D.   On: 08/23/2016 13:40    EKG: Independently reviewed.  Assessment/Plan Active Problems:   Sepsis ([place])   1. Acute on Chronic Respiratory Failure with hypoxia -at this time she will be continued on oxygen therapy -continue with steroids as you are -will need to have oxygen reassessed prior to discharge and may need home O2 -Venous blood gas results noted  2. Cavitary infected bleb/Pneumonia -currently on augmentin switched from zosyn -culture data noted neg MRSA and neg blood cultures -bronch not likely to be helpful and would be risky  3. COPD severe exacerbation -oxygen therapy -steroids as ordered -continue with bronchodilators and antibiotics  4. H/o tobacco use -no longer smoking    Code Status: full code DVT Prophylaxis:hepain Family Communication: none Disposition Plan: home  Time spent: 47min    I have personally obtained a history, examined the patient, evaluated laboratory and imaging results, formulated the assessment and plan and placed orders.  The Patient requires high complexity decision making for assessment and support.    Patient needs to follow up in the office after discharge 2 weeks Please call [phone] for appointment  Allyne Gee, MD [facility] Pulmonary Critical Care Medicine Sleep Medicine

## 2016-08-24 NOTE — Progress Notes (Signed)
Patient is A&O x4. Up with SBA. SOB with minimal activity. Still on 2L O2, sats 96%. Lungs decreased throughout. Good appetite.Tele discontinued and changed ABX from IV to PO, tolerated well.

## 2016-08-24 NOTE — Progress Notes (Addendum)
[place] at [place] NAME: Samantha Ho    MR#:  [id number]  DATE OF BIRTH:  March 05, 1956  SUBJECTIVE:  CHIEF COMPLAINT:   Chief Complaint  Patient presents with  . Chest Pain  . Shortness of Breath   - Admitted with COPD exacerbation. Not on home oxygen and currently requiring 2 L oxygen. -Complains of left-sided pleuritic chest pain and cough.  REVIEW OF SYSTEMS:  Review of Systems  Constitutional: Negative for chills, fever and malaise/fatigue.  HENT: Negative for congestion, ear discharge, hearing loss and nosebleeds.   Eyes: Negative for blurred vision and double vision.  Respiratory: Positive for cough and shortness of breath. Negative for wheezing.   Cardiovascular: Positive for chest pain. Negative for palpitations and leg swelling.  Gastrointestinal: Negative for abdominal pain, constipation, diarrhea, nausea and vomiting.  Genitourinary: Negative for dysuria.  Musculoskeletal: Negative for myalgias.  Neurological: Negative for dizziness, sensory change, speech change, focal weakness, seizures and headaches.  Psychiatric/Behavioral: Negative for depression.    DRUG ALLERGIES:  No Known Allergies  VITALS:  Blood pressure 130/81, pulse 79, temperature 98.7 F (37.1 C), temperature source Oral, resp. rate 18, height 5\' 2"  (1.575 m), weight 37.2 kg (82 lb), SpO2 96 %.  PHYSICAL EXAMINATION:  Physical Exam  GENERAL:  61 y.o.-year-old Ill nourished patient lying in the bed with no acute distress.  EYES: Pupils equal, round, reactive to light and accommodation. No scleral icterus. Extraocular muscles intact.  HEENT: Head atraumatic, normocephalic. Oropharynx and nasopharynx clear.  NECK:  Supple, no jugular venous distention. No thyroid enlargement, no tenderness.  LUNGS: Scant breath sounds bilaterally, no rales,rhonchi or crepitation. Scattered expiratory wheezing. No use of accessory muscles of respiration.    CARDIOVASCULAR: S1, S2 normal. No murmurs, rubs, or gallops.  ABDOMEN: Soft, nontender, nondistended. Bowel sounds present. No organomegaly or mass.  EXTREMITIES: No pedal edema, cyanosis, or clubbing.  NEUROLOGIC: Cranial nerves II through XII are intact. Muscle strength 5/5 in all extremities. Sensation intact. Gait not checked.  PSYCHIATRIC: The patient is alert and oriented x 3.  SKIN: No obvious rash, lesion, or ulcer.    LABORATORY PANEL:   CBC  Recent Labs Lab 08/24/16 0356  WBC 4.7  HGB 11.7*  HCT 35.5  PLT 497*   ------------------------------------------------------------------------------------------------------------------  Chemistries   Recent Labs Lab 08/23/16 1308  08/24/16 0356  NA 138  --  138  K 2.8*  < > 4.4  CL 92*  --  98*  CO2 31  --  29  GLUCOSE 143*  --  148*  BUN 12  --  13  CREATININE 0.52  --  0.55  CALCIUM 9.0  --  8.6*  MG 2.1  --   --   AST 32  --   --   ALT 17  --   --   ALKPHOS 175*  --   --   BILITOT 0.6  --   --   < > = values in this interval not displayed. ------------------------------------------------------------------------------------------------------------------  Cardiac Enzymes  Recent Labs Lab 08/23/16 1308  TROPONINI 0.17*   ------------------------------------------------------------------------------------------------------------------  RADIOLOGY:  Ct Chest W Contrast  Result Date: 08/23/2016 CLINICAL DATA:  Chest pain and shortness of breath. Decreased O2 sats. EXAM: CT CHEST WITH CONTRAST TECHNIQUE: Multidetector CT imaging of the chest was performed during intravenous contrast administration. CONTRAST:  16mL ISOVUE-300 IOPAMIDOL (ISOVUE-300) INJECTION 61% COMPARISON:  Chest x-ray from earlier today. FINDINGS: Cardiovascular: Heart size normal. Coronary artery calcification is  noted. Atherosclerotic calcification is noted in the wall of the thoracic aorta. Mediastinum/Nodes: No mediastinal lymphadenopathy.  Confluent soft tissue attenuation tracks into the inferior right hilum and superior aspect of the left hilum. The esophagus has normal imaging features. There is no axillary lymphadenopathy. 7 mm left thyroid nodule. Lungs/Pleura: Marked emphysema with upper lobe bullous change noted bilaterally. This is associated with extensive architectural distortion, areas of scarring, and bronchiectasis. Areas of irregular pleural thickening and thick walled cavitary/ bullous lesions are identified in both lung apices, left greater than right. There is associated right lower lobe collapse/ consolidation with areas of traction bronchiectasis. Posteriorly on the left, there is an air cavity which is probably a large bulla although a chronic thick walled pneumothorax, potentially from bronchopleural fistula cannot be entirely excluded. Upper Abdomen: 11 mm hypoattenuating lesion in the lateral segment of the left liver may represent a cyst. Musculoskeletal: Bone windows reveal no worrisome lytic or sclerotic osseous lesions. IMPRESSION: 1. Marked changes of emphysema with architectural distortion/scarring and bullous change in the upper lungs. 2. Multiple areas of confluent airspace consolidation bilaterally suggests multifocal pneumonia. 3. Large posterior thick walled irregular air cavity on the left may represent a chronic infected bulla or cystic cavity. This lesion is more elongated than typically seen for cavitary neoplasm of the neoplasm not excluded. Chronic pneumothorax secondary to broncho pleural fistula is a possibility 4. Airspace consolidation with a thick walled parenchymal cavitary lesion right apex. Infection/inflammation is likely etiology. Neoplasm cannot be excluded. 5. Right lower lobe dense collapse/consolidation with areas of cystic change. There is a similar, but much smaller thick walled air-filled posterior cavity associated with this disease and it is unclear whether this represents a pleural based  abnormality or posterior parenchymal air cavity. 6. Posterior left lower lobe airspace disease with substantial left lower lobe bullous change. Electronically Signed   By: Misty Stanley M.D.   On: 08/23/2016 16:14   Dg Chest Port 1 View  Result Date: 08/23/2016 CLINICAL DATA:  Chest pain and shortness of breath EXAM: PORTABLE CHEST 1 VIEW COMPARISON:  None. FINDINGS: There is patchy airspace opacity throughout the left upper lobe with volume loss. There is perihilar opacity on the right. 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 Lungs overall are hyperexpanded. Heart size is normal. Pulmonary vascularity appears somewhat distorted on both sides. There is questionable adenopathy on the right. No evident bone lesions. IMPRESSION: Upper lobe opacity on the left with apparent degree of retraction in the left upper lobe. Question whether all of the opacity in the left upper lobe represent scarring versus scarring with pneumonia. There is opacity in the right perihilar region with questionable hilar adenopathy on the right. There is underlying emphysematous change with probable scarring in the bases. As there are no prior studies to compare and there is question of adenopathy in the right hilar region, correlation with chest CT, ideally with intravenous contrast, is felt to be advisable for further assessment. Electronically Signed   By: Lowella Grip III M.D.   On: 08/23/2016 13:40    EKG:   Orders placed or performed during the hospital encounter of 08/23/16  . EKG 12-Lead  . EKG 12-Lead  . ED EKG 12-Lead  . ED EKG 12-Lead    ASSESSMENT AND PLAN:   61 year old female with past medical history significant for COPD not on home oxygen presents to the hospital secondary to worsening shortness of breath and cough. She has recently finished outpatient prednisone  and antibiotics.  #1 acute hypoxic respiratory failure-secondary to COPD exacerbation. -CT of the  chest showing multiple bullous cavities, one on the left side could be infected. -Pro calcitonin is negative. MRSA PCR is negative. Discontinue vancomycin and Zosyn. -Started on Augmentin. Sepsis resolving. -We'll get pulmonary consult. -Continue IV steroids and nebulizer treatments.  #2 pleurisy-left-sided pleuritic chest pain-secondary to multifocal pneumonia and also elongated left bullous cavities with possible infection. -Pain medications when necessary, cough medicines -Home very consult.  #3 Hypokalemia- improved with treatment  #4 DVT prophylaxis-on Lovenox   All the records are reviewed and case discussed with Care Management/Social Workerr. Management plans discussed with the patient, family and they are in agreement.  CODE STATUS: Full code  TOTAL TIME TAKING CARE OF THIS PATIENT: 38 minutes.   POSSIBLE D/C IN 1-2 DAYS, DEPENDING ON CLINICAL CONDITION.   Gladstone Lighter M.D on 08/24/2016 at 10:47 AM  Between 7am to 6pm - Pager - [phone]  After 6pm go to [email or web address] - password [email or web address] [facility]  Office  [phone]  CC: Primary care physician; Pcp Not In System

## 2016-08-24 NOTE — Progress Notes (Signed)
Pharmacist - Prescriber Communication  Enoxaparin has been modified to 30 mg subcutaneously once daily due to body weight less than 45 kg.   Taylyn Brame A. [place], [place].D., BCPS Clinical Pharmacist 08/24/2016 0121

## 2016-08-25 DIAGNOSIS — J439 Emphysema, unspecified: Secondary | ICD-10-CM

## 2016-08-25 DIAGNOSIS — J9621 Acute and chronic respiratory failure with hypoxia: Secondary | ICD-10-CM

## 2016-08-25 DIAGNOSIS — R0781 Pleurodynia: Secondary | ICD-10-CM

## 2016-08-25 DIAGNOSIS — E876 Hypokalemia: Secondary | ICD-10-CM

## 2016-08-25 DIAGNOSIS — J984 Other disorders of lung: Secondary | ICD-10-CM

## 2016-08-25 DIAGNOSIS — J189 Pneumonia, unspecified organism: Secondary | ICD-10-CM

## 2016-08-25 DIAGNOSIS — D72829 Elevated white blood cell count, unspecified: Secondary | ICD-10-CM

## 2016-08-25 DIAGNOSIS — E43 Unspecified severe protein-calorie malnutrition: Secondary | ICD-10-CM

## 2016-08-25 LAB — IRON AND TIBC
IRON: 28 ug/dL (ref 28–170)
SATURATION RATIOS: 15 % (ref 10.4–31.8)
TIBC: 182 ug/dL — AB (ref 250–450)
UIBC: 154 ug/dL

## 2016-08-25 LAB — FERRITIN: Ferritin: 94 ng/mL (ref 11–307)

## 2016-08-25 LAB — HIV ANTIBODY (ROUTINE TESTING W REFLEX): HIV SCREEN 4TH GENERATION: NONREACTIVE

## 2016-08-25 MED ORDER — BUDESONIDE-FORMOTEROL FUMARATE 160-4.5 MCG/ACT IN AERO: 2.0000 | INHALATION_SPRAY | Freq: Two times a day (BID) | RESPIRATORY_TRACT | 12 refills | Status: AC

## 2016-08-25 MED ORDER — ALBUTEROL SULFATE (2.5 MG/3ML) 0.083% IN NEBU: 2.5000 mg | INHALATION_SOLUTION | RESPIRATORY_TRACT | 12 refills | Status: AC

## 2016-08-25 MED ORDER — GUAIFENESIN ER 600 MG PO TB12: 600.0000 mg | ORAL_TABLET | Freq: Two times a day (BID) | ORAL | 0 refills | Status: AC

## 2016-08-25 MED ORDER — BENZONATATE 100 MG PO CAPS: 100.0000 mg | ORAL_CAPSULE | Freq: Three times a day (TID) | ORAL | 0 refills | Status: AC

## 2016-08-25 MED ORDER — AMOXICILLIN-POT CLAVULANATE 875-125 MG PO TABS: 1.0000 | ORAL_TABLET | Freq: Two times a day (BID) | ORAL | 0 refills | Status: DC

## 2016-08-25 MED ORDER — PREDNISONE 10 MG (21) PO TBPK: ORAL_TABLET | ORAL | 0 refills | Status: DC

## 2016-08-25 NOTE — Care Management Note (Signed)
Case Management Note  Patient Details  Name: Samantha Ho MRN: [id number] Date of Birth: Dec 12, 1955  Subjective/Objective:          Mrs Deibel qualifies for new home oxygen today. A request for a nebulizer machine and a portable tank to be delivered to Mrs Heathcote in her [facility] room 149 today, as well as a new home oxygen setup at her home, was called to [facility].           Action/Plan:   Expected Discharge Date:  08/25/16               Expected Discharge Plan:   New 02  In-House Referral:     Discharge [place] with new oxygen per chronic COPD.  Post Acute Care Choice:   Advanced Choice offered to:   Patient  DME Arranged:   Advanced D[facility] 02 DME Agency:    [facility]  HH Arranged:   None needed [place] Agency:   none  Status of Service:   Done  If discussed at Long Length of Stay Meetings, dates discussed:    Additional Comments:  Anhthu Perdew A, RN 08/25/2016, 12:51 PM

## 2016-08-25 NOTE — Care Management Note (Signed)
Case Management Note  Patient Details  Name: Jalayiah Einspahr MRN: [id number] Date of Birth: [birth year]  Subjective/Objective:    New home oxygen portable 02 tank was delivered to Mrs Fennema in her [facility] room 149 by [facility].  Mrs Yebra began having anxiety about discharge home with new oxygen. Dr Ether Griffins is aware. PT has recommended home health PT, Dr Ether Griffins is aware.                 Action/Plan:   Expected Discharge Date:  08/25/16               Expected Discharge Plan:     In-House Referral:     Discharge planning Services     Post Acute Care Choice:    Choice offered to:     DME Arranged:    DME Agency:     HH Arranged:    HH Agency:     Status of Service:     If discussed at [employer], dates discussed:    Additional Comments:  Anastacio Bua A, RN 08/25/2016, 3:22 PM

## 2016-08-25 NOTE — Progress Notes (Signed)
Patient is A&O x4. Up with SBA, uses BSC. SOB with any activity. Unable to wean off oxygen. Tank in room to send home at discharge. NSL in place. LBM, this shift. Good appetite.

## 2016-08-25 NOTE — Evaluation (Signed)
Physical Therapy Evaluation Patient Details Name: Samantha Ho MRN: [id number] DOB: 11/17/1955 Today's Date: 08/25/2016   History of Present Illness  Pt admitted for sepsis with complaints of SOB symptoms. Pt with history of COPD.  Clinical Impression  Pt is a pleasant 61 year old female who was admitted for sepsis with complaints of SOB symtoms. Pt performs bed mobility with independence, transfers with mod I, and ambulation with cga and no AD. Pt demonstrates deficits with strength/endurance. All mobility performed on 2L of O2 with sats decreasing with exertion. Cues for pursed lip breathing and with rest, improved to 90%. Would benefit from skilled PT to address above deficits and promote optimal return to PLOF. Recommend transition to [facility] upon discharge from acute hospitalization.      Follow Up Recommendations Home health PT    Equipment Recommendations  None recommended by PT    Recommendations for Other Services       Precautions / Restrictions Precautions Precautions: Fall Restrictions Weight Bearing Restrictions: No      Mobility  Bed Mobility Overal bed mobility: Independent             General bed mobility comments: safe technique performed with all mobility  Transfers Overall transfer level: Modified independent Equipment used: None             General transfer comment: pushed on bed for standing, pt performed transfer quickly, became dizzy and sat back down. Pt cued for slow transfer and on 2nd attempt, pt able to stand with upright posture.  Ambulation/Gait Ambulation/Gait assistance: Min guard Ambulation Distance (Feet): 80 Feet Assistive device: None Gait Pattern/deviations: Step-through pattern     General Gait Details: Pt ambulated 2 bouts in room without RW. Pt refused to go in hallway because she doesn't like people. All mobility performed on 2L of O2 with sats decreasing to 86% with exertion. Pt with no LOB and safe technique during  ambulation  Stairs            Wheelchair Mobility    Modified Rankin (Stroke Patients Only)       Balance Overall balance assessment: Independent                                           Pertinent Vitals/Pain Pain Assessment: No/denies pain    Home Living Family/patient expects to be discharged to:: Private residence Living Arrangements: Spouse/significant other Available Help at Discharge: Family Type of Home: House Home Access: Stairs to enter Entrance Stairs-Rails: None Technical brewer of Steps: 1 Home Layout: One level Home Equipment: None      Prior Function Level of Independence: Independent         Comments: wasn't using AD prior to admission     Hand Dominance        Extremity/Trunk Assessment   Upper Extremity Assessment Upper Extremity Assessment: Overall WFL for tasks assessed    Lower Extremity Assessment Lower Extremity Assessment: Generalized weakness (B LE grossly 4/5)       Communication   Communication: No difficulties  Cognition Arousal/Alertness: Awake/alert Behavior During Therapy: WFL for tasks assessed/performed Overall Cognitive Status: Within Functional Limits for tasks assessed                      General Comments      Exercises     Assessment/Plan    PT Assessment Patient  needs continued PT services  PT Problem List Decreased strength;Decreased activity tolerance;Cardiopulmonary status limiting activity       PT Treatment Interventions Gait training;Therapeutic exercise    PT Goals (Current goals can be found in the Care Plan section)  Acute Rehab PT Goals Patient Stated Goal: to get stronger PT Goal Formulation: With patient Time For Goal Achievement: 09/08/16 Potential to Achieve Goals: Good    Frequency Min 2X/week   Barriers to discharge        Co-evaluation               End of Session Equipment Utilized During Treatment: Gait belt;Oxygen Activity  Tolerance: Treatment limited secondary to medical complications (Comment) Patient left: in bed Nurse Communication: Mobility status PT Visit Diagnosis: Muscle weakness (generalized) (M62.81);Difficulty in walking, not elsewhere classified (R26.2)         Time: 1210-1226 PT Time Calculation (min) (ACUTE ONLY): 16 min   Charges:   PT Evaluation $PT Eval Low Complexity: 1 Procedure     PT G Codes:         Rosaleah Person 09/13/2016, 2:08 PM Greggory Stallion, PT, DPT [phone]

## 2016-08-25 NOTE — Progress Notes (Signed)
SATURATION QUALIFICATIONS: (This note is used to comply with regulatory documentation for home oxygen)  Patient Saturations on Room Air at Rest = 81%  Patient Saturations on Room Air while Ambulating = %  Patient Saturations on 2 Liters of oxygen while Ambulating = 87%  Please briefly explain why patient needs home oxygen: Shortness of breath with short distance walking. Does recover and rebounds to 93% on the 2L.

## 2016-08-25 NOTE — Progress Notes (Signed)
PT Cancellation Note  Patient Details Name: Samantha Ho MRN: [id number] DOB: 12-26-1955   Cancelled Treatment:    Reason Eval/Treat Not Completed:  (Pt reported she was just given O2 again, asked to wait).  Will try again later to see if she can be mobilized with PT.   Ramond Dial 08/25/2016, 11:22 AM   Mee Hives, PT MS Acute Rehab Dept. Number: [place] [facility] [place]

## 2016-08-25 NOTE — Progress Notes (Signed)
SATURATION QUALIFICATIONS: (This note is used to comply with regulatory documentation for home oxygen)  Patient Saturations on Room Air at Rest = 81 %  Patient Saturations on Room Air while Ambulating =  Unable to obtain per 02 sats dropping dangerously low %  Patient Saturations on 2 Liters of oxygen while Ambulating = 87%  Please briefly explain why patient needs home oxygen:  Chronic COPD 02 sat qualifications provided by Arita Miss, RN on 08/25/16 at 12:29pm.

## 2016-08-25 NOTE — Progress Notes (Addendum)
[facility] [place] NAME: Samantha Ho    MR#:  [id number]  DATE OF BIRTH:  1955-07-08  SUBJECTIVE:  CHIEF COMPLAINT:   Chief Complaint  Patient presents with  . Chest Pain  . Shortness of Breath   - Admitted with COPD exacerbation. Not on home oxygen and currently requiring 2 L oxygen. Cough has subsided as well as shortness of breath, patient was seen by pulmonologist recommended follow-up as outpatient, no bronchoscopy.  REVIEW OF SYSTEMS:  Review of Systems  Constitutional: Negative for chills, fever and malaise/fatigue.  HENT: Negative for congestion, ear discharge, hearing loss and nosebleeds.   Eyes: Negative for blurred vision and double vision.  Respiratory: Positive for cough and shortness of breath. Negative for wheezing.   Cardiovascular: Positive for chest pain. Negative for palpitations and leg swelling.  Gastrointestinal: Negative for abdominal pain, constipation, diarrhea, nausea and vomiting.  Genitourinary: Negative for dysuria.  Musculoskeletal: Negative for myalgias.  Neurological: Negative for dizziness, sensory change, speech change, focal weakness, seizures and headaches.  Psychiatric/Behavioral: Negative for depression.    DRUG ALLERGIES:  No Known Allergies  VITALS:  Blood pressure 137/82, pulse 85, temperature 98.5 F (36.9 C), temperature source Oral, resp. rate 18, height 5\' 2"  (1.575 m), weight 37.2 kg (82 lb), SpO2 94 %.  PHYSICAL EXAMINATION:  Physical Exam  GENERAL:  61 y.o.-year-old Ill nourished patient lying in the bed with no acute distress.  EYES: Pupils equal, round, reactive to light and accommodation. No scleral icterus. Extraocular muscles intact.  HEENT: Head atraumatic, normocephalic. Oropharynx and nasopharynx clear.  NECK:  Supple, no jugular venous distention. No thyroid enlargement, no tenderness.  LUNGS: Scant breath sounds bilaterally, no rales,rhonchi or crepitation.  Scattered expiratory wheezing. No use of accessory muscles of respiration.  CARDIOVASCULAR: S1, S2 normal. No murmurs, rubs, or gallops.  ABDOMEN: Soft, nontender, nondistended. Bowel sounds present. No organomegaly or mass.  EXTREMITIES: No pedal edema, cyanosis, or clubbing.  NEUROLOGIC: Cranial nerves II through XII are intact. Muscle strength 5/5 in all extremities. Sensation intact. Gait not checked.  PSYCHIATRIC: The patient is alert and oriented x 3.  SKIN: No obvious rash, lesion, or ulcer.    LABORATORY PANEL:   CBC  Recent Labs Lab 08/24/16 0356  WBC 4.7  HGB 11.7*  HCT 35.5  PLT 497*   ------------------------------------------------------------------------------------------------------------------  Chemistries   Recent Labs Lab 08/23/16 1308  08/24/16 0356  NA 138  --  138  K 2.8*  < > 4.4  CL 92*  --  98*  CO2 31  --  29  GLUCOSE 143*  --  148*  BUN 12  --  13  CREATININE 0.52  --  0.55  CALCIUM 9.0  --  8.6*  MG 2.1  --   --   AST 32  --   --   ALT 17  --   --   ALKPHOS 175*  --   --   BILITOT 0.6  --   --   < > = values in this interval not displayed. ------------------------------------------------------------------------------------------------------------------  Cardiac Enzymes  Recent Labs Lab 08/23/16 1308  TROPONINI 0.17*   ------------------------------------------------------------------------------------------------------------------  RADIOLOGY:  Ct Chest W Contrast  Result Date: 08/23/2016 CLINICAL DATA:  Chest pain and shortness of breath. Decreased O2 sats. EXAM: CT CHEST WITH CONTRAST TECHNIQUE: Multidetector CT imaging of the chest was performed during intravenous contrast administration. CONTRAST:  72mL ISOVUE-300 IOPAMIDOL (ISOVUE-300) INJECTION 61% COMPARISON:  Chest x-ray from  earlier today. FINDINGS: Cardiovascular: Heart size normal. Coronary artery calcification is noted. Atherosclerotic calcification is noted in the wall of  the thoracic aorta. Mediastinum/Nodes: No mediastinal lymphadenopathy. Confluent soft tissue attenuation tracks into the inferior right hilum and superior aspect of the left hilum. The esophagus has normal imaging features. There is no axillary lymphadenopathy. 7 mm left thyroid nodule. Lungs/Pleura: Marked emphysema with upper lobe bullous change noted bilaterally. This is associated with extensive architectural distortion, areas of scarring, and bronchiectasis. Areas of irregular pleural thickening and thick walled cavitary/ bullous lesions are identified in both lung apices, left greater than right. There is associated right lower lobe collapse/ consolidation with areas of traction bronchiectasis. Posteriorly on the left, there is an air cavity which is probably a large bulla although a chronic thick walled pneumothorax, potentially from bronchopleural fistula cannot be entirely excluded. Upper Abdomen: 11 mm hypoattenuating lesion in the lateral segment of the left liver may represent a cyst. Musculoskeletal: Bone windows reveal no worrisome lytic or sclerotic osseous lesions. IMPRESSION: 1. Marked changes of emphysema with architectural distortion/scarring and bullous change in the upper lungs. 2. Multiple areas of confluent airspace consolidation bilaterally suggests multifocal pneumonia. 3. Large posterior thick walled irregular air cavity on the left may represent a chronic infected bulla or cystic cavity. This lesion is more elongated than typically seen for cavitary neoplasm of the neoplasm not excluded. Chronic pneumothorax secondary to broncho pleural fistula is a possibility 4. Airspace consolidation with a thick walled parenchymal cavitary lesion right apex. Infection/inflammation is likely etiology. Neoplasm cannot be excluded. 5. Right lower lobe dense collapse/consolidation with areas of cystic change. There is a similar, but much smaller thick walled air-filled posterior cavity associated with this  disease and it is unclear whether this represents a pleural based abnormality or posterior parenchymal air cavity. 6. Posterior left lower lobe airspace disease with substantial left lower lobe bullous change. Electronically Signed   By: Misty Stanley M.D.   On: 08/23/2016 16:14   Dg Chest Port 1 View  Result Date: 08/23/2016 CLINICAL DATA:  Chest pain and shortness of breath EXAM: PORTABLE CHEST 1 VIEW COMPARISON:  None. FINDINGS: There is patchy airspace opacity throughout the left upper lobe with volume loss. There is perihilar opacity on the right. 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 Lungs overall are hyperexpanded. Heart size is normal. Pulmonary vascularity appears somewhat distorted on both sides. There is questionable adenopathy on the right. No evident bone lesions. IMPRESSION: Upper lobe opacity on the left with apparent degree of retraction in the left upper lobe. Question whether all of the opacity in the left upper lobe represent scarring versus scarring with pneumonia. There is opacity in the right perihilar region with questionable hilar adenopathy on the right. There is underlying emphysematous change with probable scarring in the bases. As there are no prior studies to compare and there is question of adenopathy in the right hilar region, correlation with chest CT, ideally with intravenous contrast, is felt to be advisable for further assessment. Electronically Signed   By: Lowella Grip III M.D.   On: 08/23/2016 13:40    EKG:   Orders placed or performed during the hospital encounter of 08/23/16  . EKG 12-Lead  . EKG 12-Lead  . ED EKG 12-Lead  . ED EKG 12-Lead    ASSESSMENT AND PLAN:   61 year old female with past medical history significant for COPD not on home oxygen presents to the hospital secondary to worsening  shortness of breath and cough. She has recently finished outpatient prednisone and antibiotics.  #1 acute hypoxic  respiratory failure-secondary to COPD exacerbation. -CT of the chest showing multiple bullous cavities, one on the left side could be infected. -Pro calcitonin is negative. MRSA PCR is negative. Now off vancomycin and Zosyn. Continue Augmentin to complete a two-week course. Sepsis resolving. Appreciate pulmonology's input, follow-up with pulmonologist in 1-2 weeks after discharge ,  no bronchoscopy was recommended -Continue steroids and nebulizer treatments. Will need oxygen therapy at home. Get Quantiferon gold as the patient is on steroids  #2 pleurisy-left-sided pleuritic chest pain-secondary to multifocal pneumonia and also elongated left bullous cavities with possible infection. -Pain medications when necessary, cough medicines, chest pain has improved -.  #3 Hypokalemia- resolved with treatment  #4 DVT prophylaxis-on Lovenox  #5. Leukocytosis, resolved  #6. Generalized weakness, physical therapist evaluation is pending  #7. Thrombocytosis, could be related to iron deficiency, get iron studies, Hemoccult   All the records are reviewed and case discussed with Care Management/Social Workerr. Management plans discussed with the patient, family and they are in agreement.  CODE STATUS: Full code  TOTAL TIME TAKING CARE OF THIS PATIENT: 40 minutes.   POSSIBLE D/C IN 1-2 DAYS, DEPENDING ON CLINICAL CONDITION.   Theodoro Grist M.D on 08/25/2016 at 12:00 PM  Between 7am to 6pm - Pager - [phone]  After 6pm go to [email or web address] - password [email or web address] [facility]  Office  [phone]  CC: Primary care physician; Pcp Not In System

## 2016-08-26 LAB — GLUCOSE, CAPILLARY: Glucose-Capillary: 175 mg/dL — ABNORMAL HIGH (ref 65–99)

## 2016-08-26 MED ORDER — INSULIN ASPART 100 UNIT/ML ~~LOC~~ SOLN: 0.0000 [IU] | Freq: Every day | SUBCUTANEOUS | Status: DC

## 2016-08-26 MED ORDER — INSULIN ASPART 100 UNIT/ML ~~LOC~~ SOLN
0.0000 [IU] | Freq: Three times a day (TID) | SUBCUTANEOUS | Status: DC
  Administered 2016-08-26: 2 [IU] via SUBCUTANEOUS
  Filled 2016-08-26: qty 2

## 2016-08-26 NOTE — Care Management Note (Signed)
Case Management Note  Patient Details  Name: Samantha Ho MRN: [id number] Date of Birth: [birth year]  Subjective/Objective:      A portable oxygen tank was delivered to [name] in her [facility] room 149 yesterday in anticipation of discharge yesterday. [name] was not discharged until today. Jermaine at [facility] was notified of new HH-RN order and that the home 02 set up will need to be delivered today to [name]'s home.               Action/Plan:   Expected Discharge Date:  08/26/16               Expected Discharge Plan:     In-House Referral:     Discharge planning Services     Post Acute Care Choice:    Choice offered to:     DME Arranged:    DME Agency:     HH Arranged:    HH Agency:     Status of Service:     If discussed at [employer], dates discussed:    Additional Comments:  Doneisha Ivey A, RN 08/26/2016, 10:57 AM

## 2016-08-26 NOTE — Progress Notes (Signed)
Pt is being discharged home. O2 tank given. Discharge papers given and explained to pt and pt's daughter. Pt and pt's daughter verbalized understanding. Meds and f/u appointments reviewed.

## 2016-08-26 NOTE — Discharge Summary (Signed)
[facility] NAME: Samantha Ho    MR#:  [id number]  DATE OF BIRTH:  06/16/1956  DATE OF ADMISSION:  08/23/2016 ADMITTING PHYSICIAN: Nicholes Mango, MD  DATE OF DISCHARGE: No discharge date for patient encounter.  PRIMARY CARE PHYSICIAN: Pcp Not In System     ADMISSION DIAGNOSIS:  NSTEMI (non-ST elevated myocardial infarction) ([place]) [I21.4] Community acquired pneumonia of left upper lobe of lung ([place]) [J18.1]  DISCHARGE DIAGNOSIS:  Principal Problem:   Acute on chronic respiratory failure with hypoxia (HCC) Active Problems:   Sepsis ([place])   Pneumonia   Emphysema lung ([place])   Cavitary lesion of lung   Hypokalemia   Leukocytosis   Severe protein-calorie malnutrition (HCC)   Pleuritic chest pain   SECONDARY DIAGNOSIS:   Past Medical History:  Diagnosis Date  . COPD (chronic obstructive pulmonary disease) ([place])     .pro HOSPITAL COURSE:  The patient is 61-year-old Caucasian female with past medical history significant for history of tobacco abuse, COPD, prior positive PPD, who presents to the hospital with complaints of not feeling well for the past 2 weeks. She reported cough, worsening shortness of breath. She presented to the hospital for further evaluation and treatment. In emergency room, she was given IV steroids, nebulizing therapy, chest x-ray revealed pneumonia, she was initiated on Rocephin and Zithromax and admitted to the hospital. She had CT scan of the chest with contrast, revealing multiple derangements, including emphysema, multifocal pneumonia, right apex cavitary lesion. She was seen by pulmonologist, who recommended steroids, oxygen therapy, antibiotics, who recommended against bronchoscopy. Blood cultures were checked, negative, MRSA PCR was negative, sputum culture is pending. Patient was initiated on Zosyn initially, which was changed to Augmentin for home. Patient was advised to continue antibiotic  therapy to complete 2 week course. She is to follow-up with pulmonologist as outpatient. Quantiferon Gold was taken. Patient was reassessed oxygen use at home and recommended to use 1 L of oxygen at rest as well as on exertion. Discussion by problem: #1 acute hypoxic respiratory failure-secondary to COPD exacerbation. -CT of the chest showed multiple bullous cavities, one on the left side could be infected. -Pro calcitonin is negative. MRSA PCR is negative. Now off vancomycin and Zosyn. Continue Augmentin to complete a two-week course. Sepsis resolved. Appreciate pulmonology's input, follow-up with pulmonologist in 1 week after discharge ,  no bronchoscopy was recommended -Continue steroids and nebulizer treatments.  The patient was discharged on oxygen therapy at home. Quantiferon gold taken, pending, most recent PPD was in 2001, negative per patient.   #2 pleurisy-left-sided pleuritic chest pain-secondary to multifocal pneumonia and also elongated left bullous cavities with possible infection. Chest pain has resolved with therapy, supportive therapy at home -.  #3 Hypokalemia- resolved with treatment   #4. Leukocytosis, resolved  #5. Generalized weakness, physical therapist evaluated the patient and recommended home health services, which are being arranged for her upon discharge  #6. Thrombocytosis, was initially thought to be due to iron deficiency, however iron studies were unremarkable with good iron saturation and normal ferritin level,, Hemoccult was ordered, not obtained, follow-up as outpatient  DISCHARGE CONDITIONS:   stable  CONSULTS OBTAINED:  Treatment Team:  Allyne Gee, MD  DRUG ALLERGIES:  No Known Allergies  DISCHARGE MEDICATIONS:   Current Discharge Medication List    START taking these medications   Details  albuterol (PROVENTIL) (2.5 MG/3ML) 0.083% nebulizer solution Take 3 mLs (2.5 mg total) by nebulization every 4 (  four) hours. Qty: 180 vial,  Refills: 12    amoxicillin-clavulanate (AUGMENTIN) 875-125 MG tablet Take 1 tablet by mouth every 12 (twelve) hours. Qty: 24 tablet, Refills: 0    benzonatate (TESSALON) 100 MG capsule Take 1 capsule (100 mg total) by mouth 3 (three) times daily. Qty: 20 capsule, Refills: 0    budesonide-formoterol (SYMBICORT) 160-4.5 MCG/ACT inhaler Inhale 2 puffs into the lungs 2 (two) times daily. Qty: 1 Inhaler, Refills: 12    guaiFENesin (MUCINEX) 600 MG 12 hr tablet Take 1 tablet (600 mg total) by mouth 2 (two) times daily. Qty: 20 tablet, Refills: 0    predniSONE (STERAPRED UNI-PAK 21 TAB) 10 MG (21) TBPK tablet Please take 6 pills in the morning on the day 1, then taper by one pill daily until finished, thank you Qty: 21 tablet, Refills: 0      CONTINUE these medications which have NOT CHANGED   Details  Aspirin-Acetaminophen-Caffeine (GOODY HEADACHE PO) Take 1 packet by mouth as needed (headache).    ipratropium (ATROVENT HFA) 17 MCG/ACT inhaler Inhale 2 puffs into the lungs every 6 (six) hours as needed for wheezing.         DISCHARGE INSTRUCTIONS:    The patient is to follow-up with pulmonologist and infectious disease specialist within 1-2 weeks after discharge  If you experience worsening of your admission symptoms, develop shortness of breath, life threatening emergency, suicidal or homicidal thoughts you must seek medical attention immediately by calling 911 or calling your MD immediately  if symptoms less severe.  You Must read complete instructions/literature along with all the possible adverse reactions/side effects for all the Medicines you take and that have been prescribed to you. Take any new Medicines after you have completely understood and accept all the possible adverse reactions/side effects.   Please note  You were cared for by a hospitalist during your hospital stay. If you have any questions about your discharge medications or the care you received while you were  in the hospital after you are discharged, you can call the unit and asked to speak with the hospitalist on call if the hospitalist that took care of you is not available. Once you are discharged, your primary care physician will handle any further medical issues. Please note that NO REFILLS for any discharge medications will be authorized once you are discharged, as it is imperative that you return to your primary care physician (or establish a relationship with a primary care physician if you do not have one) for your aftercare needs so that they can reassess your need for medications and monitor your lab values.    Today   CHIEF COMPLAINT:   Chief Complaint  Patient presents with  . Chest Pain  . Shortness of Breath    HISTORY OF PRESENT ILLNESS:  Samantha Ho  is a 61 y.o. female with a known history of tobacco abuse, COPD, prior positive PPD, who presents to the hospital with complaints of not feeling well for the past 2 weeks. She reported cough, worsening shortness of breath. She presented to the hospital for further evaluation and treatment. In emergency room, she was given IV steroids, nebulizing therapy, chest x-ray revealed pneumonia, she was initiated on Rocephin and Zithromax and admitted to the hospital. She had CT scan of the chest with contrast, revealing multiple derangements, including emphysema, multifocal pneumonia, right apex cavitary lesion. She was seen by pulmonologist, who recommended steroids, oxygen therapy, antibiotics, who recommended against bronchoscopy. Blood cultures were checked, negative, MRSA  PCR was negative, sputum culture is pending. Patient was initiated on Zosyn initially, which was changed to Augmentin for home. Patient was advised to continue antibiotic therapy to complete 2 week course. She is to follow-up with pulmonologist as outpatient. Quantiferon Gold was taken. Patient was reassessed oxygen use at home and recommended to use 1 L of oxygen at rest as  well as on exertion. Discussion by problem: #1 acute hypoxic respiratory failure-secondary to COPD exacerbation. -CT of the chest showed multiple bullous cavities, one on the left side could be infected. -Pro calcitonin is negative. MRSA PCR is negative. Now off vancomycin and Zosyn. Continue Augmentin to complete a two-week course. Sepsis resolved. Appreciate pulmonology's input, follow-up with pulmonologist in 1 week after discharge ,  no bronchoscopy was recommended -Continue steroids and nebulizer treatments.  The patient was discharged on oxygen therapy at home. Quantiferon gold taken, pending, most recent PPD was in 2001, negative per patient.   #2 pleurisy-left-sided pleuritic chest pain-secondary to multifocal pneumonia and also elongated left bullous cavities with possible infection. Chest pain has resolved with therapy, supportive therapy at home -.  #3 Hypokalemia- resolved with treatment   #4. Leukocytosis, resolved  #5. Generalized weakness, physical therapist evaluated the patient and recommended home health services, which are being arranged for her upon discharge  #6. Thrombocytosis, was initially thought to be due to iron deficiency, however iron studies were unremarkable with good iron saturation and normal ferritin level,, Hemoccult was ordered, not obtained, follow-up as outpatient    VITAL SIGNS:  Blood pressure (!) 147/88, pulse 77, temperature 98.2 F (36.8 C), temperature source Oral, resp. rate (!) 22, height 5\' 2"  (1.575 m), weight 37.2 kg (82 lb), SpO2 95 %.  I/O:  No intake or output data in the 24 hours ending 08/26/16 1258  PHYSICAL EXAMINATION:  GENERAL:  61 y.o.-year-old patient lying in the bed with no acute distress.  EYES: Pupils equal, round, reactive to light and accommodation. No scleral icterus. Extraocular muscles intact.  HEENT: Head atraumatic, normocephalic. Oropharynx and nasopharynx clear.  NECK:  Supple, no jugular venous  distention. No thyroid enlargement, no tenderness.  LUNGS: Normal breath sounds bilaterally, no wheezing, rales,rhonchi or crepitation. No use of accessory muscles of respiration.  CARDIOVASCULAR: S1, S2 normal. No murmurs, rubs, or gallops.  ABDOMEN: Soft, non-tender, non-distended. Bowel sounds present. No organomegaly or mass.  EXTREMITIES: No pedal edema, cyanosis, or clubbing.  NEUROLOGIC: Cranial nerves II through XII are intact. Muscle strength 5/5 in all extremities. Sensation intact. Gait not checked.  PSYCHIATRIC: The patient is alert and oriented x 3.  SKIN: No obvious rash, lesion, or ulcer.   DATA REVIEW:   CBC  Recent Labs Lab 08/24/16 0356  WBC 4.7  HGB 11.7*  HCT 35.5  PLT 497*    Chemistries   Recent Labs Lab 08/23/16 1308  08/24/16 0356  NA 138  --  138  K 2.8*  < > 4.4  CL 92*  --  98*  CO2 31  --  29  GLUCOSE 143*  --  148*  BUN 12  --  13  CREATININE 0.52  --  0.55  CALCIUM 9.0  --  8.6*  MG 2.1  --   --   AST 32  --   --   ALT 17  --   --   ALKPHOS 175*  --   --   BILITOT 0.6  --   --   < > = values in this interval not  displayed.  Cardiac Enzymes  Recent Labs Lab 08/23/16 1308  TROPONINI 0.17*    Microbiology Results  Results for orders placed or performed during the hospital encounter of 08/23/16  Blood Culture (routine x 2)     Status: None (Preliminary result)   Collection Time: 08/23/16  1:08 PM  Result Value Ref Range Status   Specimen Description BLOOD L ARM  Final   Special Requests BOTTLES DRAWN AEROBIC AND ANAEROBIC BCAV  Final   Culture NO GROWTH 3 DAYS  Final   Report Status PENDING  Incomplete  Urine culture     Status: None   Collection Time: 08/23/16  1:09 PM  Result Value Ref Range Status   Specimen Description URINE, RANDOM  Final   Special Requests NONE  Final   Culture   Final    NO GROWTH Performed at [facility], [place] [address], [place],  [address]    Report Status 08/24/2016 FINAL  Final   Blood Culture (routine x 2)     Status: None (Preliminary result)   Collection Time: 08/23/16  1:10 PM  Result Value Ref Range Status   Specimen Description BLOOD R ARM  Final   Special Requests BOTTLES DRAWN AEROBIC AND ANAEROBIC BCAV  Final   Culture NO GROWTH 3 DAYS  Final   Report Status PENDING  Incomplete  MRSA PCR Screening     Status: None   Collection Time: 08/23/16  6:00 PM  Result Value Ref Range Status   MRSA by PCR NEGATIVE NEGATIVE Final    Comment:        The GeneXpert MRSA Assay (FDA approved for NASAL specimens only), is one component of a comprehensive MRSA colonization surveillance program. It is not intended to diagnose MRSA infection nor to guide or monitor treatment for MRSA infections.     RADIOLOGY:  No results found.  EKG:   Orders placed or performed during the hospital encounter of 08/23/16  . EKG 12-Lead  . EKG 12-Lead  . ED EKG 12-Lead  . ED EKG 12-Lead      Management plans discussed with the patient, family and they are in agreement.  CODE STATUS:     Code Status Orders        Start     Ordered   08/23/16 1700  Full code  Continuous     08/23/16 1659    Code Status History    Date Active Date Inactive Code Status Order ID Comments User Context   This patient has a current code status but no historical code status.      TOTAL TIME TAKING CARE OF THIS PATIENT: 40 minutes.    Theodoro Grist M.D on 08/26/2016 at 12:58 PM  Between 7am to 6pm - Pager - [phone]  After 6pm go to [email or web address] - password [email or web address] [facility]  Office  [phone]  CC: Primary care physician; Pcp Not In System

## 2016-08-27 LAB — HEMOGLOBIN A1C
HEMOGLOBIN A1C: 5.9 % — AB (ref 4.8–5.6)
MEAN PLASMA GLUCOSE: 123 mg/dL

## 2016-08-27 LAB — BLOOD GAS, VENOUS
ACID-BASE EXCESS: 10.5 mmol/L — AB (ref 0.0–2.0)
BICARBONATE: 36.7 mmol/L — AB (ref 20.0–28.0)
PATIENT TEMPERATURE: 37
pCO2, Ven: 54 mmHg (ref 44.0–60.0)
pH, Ven: 7.44 — ABNORMAL HIGH (ref 7.250–7.430)

## 2016-08-28 LAB — CULTURE, BLOOD (ROUTINE X 2)
CULTURE: NO GROWTH
Culture: NO GROWTH

## 2016-09-15 ENCOUNTER — Encounter: Payer: Self-pay | Admitting: Emergency Medicine

## 2016-09-15 ENCOUNTER — Emergency Department: Payer: BC Managed Care – PPO

## 2016-09-15 ENCOUNTER — Inpatient Hospital Stay
Admission: EM | Admit: 2016-09-15 | Discharge: 2016-09-19 | DRG: 193 | Disposition: A | Discharge status: Home / Self Care | Payer: BC Managed Care – PPO | Attending: Internal Medicine | Admitting: Internal Medicine

## 2016-09-15 DIAGNOSIS — I959 Hypotension, unspecified: Secondary | ICD-10-CM | POA: Diagnosis present

## 2016-09-15 DIAGNOSIS — J9621 Acute and chronic respiratory failure with hypoxia: Secondary | ICD-10-CM | POA: Diagnosis present

## 2016-09-15 DIAGNOSIS — Z79899 Other long term (current) drug therapy: Secondary | ICD-10-CM | POA: Diagnosis not present

## 2016-09-15 DIAGNOSIS — R0602 Shortness of breath: Secondary | ICD-10-CM | POA: Diagnosis not present

## 2016-09-15 DIAGNOSIS — I248 Other forms of acute ischemic heart disease: Secondary | ICD-10-CM | POA: Diagnosis present

## 2016-09-15 DIAGNOSIS — J984 Other disorders of lung: Secondary | ICD-10-CM | POA: Diagnosis present

## 2016-09-15 DIAGNOSIS — Z87891 Personal history of nicotine dependence: Secondary | ICD-10-CM

## 2016-09-15 DIAGNOSIS — E871 Hypo-osmolality and hyponatremia: Secondary | ICD-10-CM | POA: Diagnosis present

## 2016-09-15 DIAGNOSIS — R778 Other specified abnormalities of plasma proteins: Secondary | ICD-10-CM

## 2016-09-15 DIAGNOSIS — Z681 Body mass index (BMI) 19 or less, adult: Secondary | ICD-10-CM | POA: Diagnosis not present

## 2016-09-15 DIAGNOSIS — J441 Chronic obstructive pulmonary disease with (acute) exacerbation: Secondary | ICD-10-CM | POA: Diagnosis present

## 2016-09-15 DIAGNOSIS — Z7951 Long term (current) use of inhaled steroids: Secondary | ICD-10-CM

## 2016-09-15 DIAGNOSIS — E43 Unspecified severe protein-calorie malnutrition: Secondary | ICD-10-CM | POA: Diagnosis present

## 2016-09-15 DIAGNOSIS — R7989 Other specified abnormal findings of blood chemistry: Secondary | ICD-10-CM

## 2016-09-15 DIAGNOSIS — J189 Pneumonia, unspecified organism: Secondary | ICD-10-CM | POA: Diagnosis not present

## 2016-09-15 DIAGNOSIS — Z9981 Dependence on supplemental oxygen: Secondary | ICD-10-CM

## 2016-09-15 DIAGNOSIS — J440 Chronic obstructive pulmonary disease with acute lower respiratory infection: Secondary | ICD-10-CM | POA: Diagnosis present

## 2016-09-15 DIAGNOSIS — E876 Hypokalemia: Secondary | ICD-10-CM | POA: Diagnosis present

## 2016-09-15 LAB — CBC WITH DIFFERENTIAL/PLATELET
BASOS PCT: 0 %
Basophils Absolute: 0.1 10*3/uL (ref 0–0.1)
Eosinophils Absolute: 0.2 10*3/uL (ref 0–0.7)
Eosinophils Relative: 1 %
HEMATOCRIT: 35 % (ref 35.0–47.0)
HEMOGLOBIN: 11.4 g/dL — AB (ref 12.0–16.0)
LYMPHS PCT: 4 %
Lymphs Abs: 0.7 10*3/uL — ABNORMAL LOW (ref 1.0–3.6)
MCH: 27 pg (ref 26.0–34.0)
MCHC: 32.7 g/dL (ref 32.0–36.0)
MCV: 82.5 fL (ref 80.0–100.0)
MONO ABS: 1.1 10*3/uL — AB (ref 0.2–0.9)
Monocytes Relative: 6 %
NEUTROS ABS: 16.2 10*3/uL — AB (ref 1.4–6.5)
NEUTROS PCT: 89 %
Platelets: 703 10*3/uL — ABNORMAL HIGH (ref 150–440)
RBC: 4.24 MIL/uL (ref 3.80–5.20)
RDW: 14.6 % — AB (ref 11.5–14.5)
WBC: 18.3 10*3/uL — AB (ref 3.6–11.0)

## 2016-09-15 LAB — BLOOD GAS, ARTERIAL
Acid-Base Excess: 5.2 mmol/L — ABNORMAL HIGH (ref 0.0–2.0)
Bicarbonate: 31 mmol/L — ABNORMAL HIGH (ref 20.0–28.0)
FIO2: 1
O2 Saturation: 99.8 %
PH ART: 7.4 (ref 7.350–7.450)
Patient temperature: 37
pCO2 arterial: 50 mmHg — ABNORMAL HIGH (ref 32.0–48.0)
pO2, Arterial: 216 mmHg — ABNORMAL HIGH (ref 83.0–108.0)

## 2016-09-15 LAB — COMPREHENSIVE METABOLIC PANEL
ALBUMIN: 2.4 g/dL — AB (ref 3.5–5.0)
ALT: 17 U/L (ref 14–54)
AST: 22 U/L (ref 15–41)
Alkaline Phosphatase: 105 U/L (ref 38–126)
Anion gap: 11 (ref 5–15)
BILIRUBIN TOTAL: 0.3 mg/dL (ref 0.3–1.2)
BUN: 11 mg/dL (ref 6–20)
CO2: 25 mmol/L (ref 22–32)
CREATININE: 0.3 mg/dL — AB (ref 0.44–1.00)
Calcium: 8.3 mg/dL — ABNORMAL LOW (ref 8.9–10.3)
Chloride: 97 mmol/L — ABNORMAL LOW (ref 101–111)
GFR calc Af Amer: >60 mL/min (ref >60)
GFR calc non Af Amer: >60 mL/min (ref >60)
Glucose, Bld: 103 mg/dL — ABNORMAL HIGH (ref 65–99)
POTASSIUM: 4.4 mmol/L (ref 3.5–5.1)
Sodium: 133 mmol/L — ABNORMAL LOW (ref 135–145)
TOTAL PROTEIN: 6 g/dL — AB (ref 6.5–8.1)

## 2016-09-15 LAB — MAGNESIUM: Magnesium: 1.8 mg/dL (ref 1.7–2.4)

## 2016-09-15 LAB — TROPONIN I
TROPONIN I: 0.08 ng/mL — AB (ref <0.03)
TROPONIN I: 0.28 ng/mL — AB (ref <0.03)

## 2016-09-15 LAB — BRAIN NATRIURETIC PEPTIDE: B NATRIURETIC PEPTIDE 5: 161 pg/mL — AB (ref 0.0–100.0)

## 2016-09-15 LAB — ETHANOL: Alcohol, Ethyl (B): <5 mg/dL (ref <5)

## 2016-09-15 LAB — STREP PNEUMONIAE URINARY ANTIGEN: Strep Pneumo Urinary Antigen: NEGATIVE

## 2016-09-15 MED ORDER — VANCOMYCIN HCL 500 MG IV SOLR
500.0000 mg | INTRAVENOUS | Status: DC
  Administered 2016-09-16 – 2016-09-17 (×2): 500 mg via INTRAVENOUS
  Filled 2016-09-15 (×3): qty 500

## 2016-09-15 MED ORDER — SODIUM CHLORIDE 0.9 % IV SOLN
INTRAVENOUS | Status: DC
  Administered 2016-09-15 – 2016-09-16 (×2): via INTRAVENOUS

## 2016-09-15 MED ORDER — VITAMIN D (ERGOCALCIFEROL) 1.25 MG (50000 UNIT) PO CAPS
50000.0000 [IU] | ORAL_CAPSULE | ORAL | Status: DC
Start: 2016-09-15 — End: 2016-09-15

## 2016-09-15 MED ORDER — ALBUTEROL SULFATE (2.5 MG/3ML) 0.083% IN NEBU
2.5000 mg | INHALATION_SOLUTION | RESPIRATORY_TRACT | Status: DC
  Administered 2016-09-15: 2.5 mg via RESPIRATORY_TRACT
  Filled 2016-09-15: qty 3

## 2016-09-15 MED ORDER — SODIUM CHLORIDE 0.9% FLUSH: 3.0000 mL | INTRAVENOUS | Status: DC | PRN

## 2016-09-15 MED ORDER — DEXTROSE 5 % IV SOLN: 1.0000 g | Freq: Two times a day (BID) | INTRAVENOUS | Status: DC

## 2016-09-15 MED ORDER — ONDANSETRON HCL 4 MG/2ML IJ SOLN: 4.0000 mg | Freq: Four times a day (QID) | INTRAMUSCULAR | Status: DC | PRN

## 2016-09-15 MED ORDER — GUAIFENESIN ER 600 MG PO TB12
600.0000 mg | ORAL_TABLET | Freq: Two times a day (BID) | ORAL | Status: DC
  Administered 2016-09-15 – 2016-09-19 (×9): 600 mg via ORAL
  Filled 2016-09-15 (×9): qty 1

## 2016-09-15 MED ORDER — ENOXAPARIN SODIUM 30 MG/0.3ML ~~LOC~~ SOLN
30.0000 mg | SUBCUTANEOUS | Status: DC
  Administered 2016-09-15 – 2016-09-18 (×4): 30 mg via SUBCUTANEOUS
  Filled 2016-09-15 (×4): qty 0.3

## 2016-09-15 MED ORDER — MOMETASONE FURO-FORMOTEROL FUM 200-5 MCG/ACT IN AERO
2.0000 | INHALATION_SPRAY | Freq: Two times a day (BID) | RESPIRATORY_TRACT | Status: DC
  Administered 2016-09-15 – 2016-09-19 (×9): 2 via RESPIRATORY_TRACT
  Filled 2016-09-15: qty 8.8

## 2016-09-15 MED ORDER — VANCOMYCIN HCL IN DEXTROSE 1-5 GM/200ML-% IV SOLN
1000.0000 mg | Freq: Once | INTRAVENOUS | Status: AC
  Administered 2016-09-15: 1000 mg via INTRAVENOUS
  Filled 2016-09-15: qty 200

## 2016-09-15 MED ORDER — METHYLPREDNISOLONE SODIUM SUCC 40 MG IJ SOLR
40.0000 mg | Freq: Two times a day (BID) | INTRAMUSCULAR | Status: DC
  Administered 2016-09-15 – 2016-09-17 (×4): 40 mg via INTRAVENOUS
  Filled 2016-09-15 (×4): qty 1

## 2016-09-15 MED ORDER — ACETAMINOPHEN 325 MG PO TABS: 650.0000 mg | ORAL_TABLET | Freq: Four times a day (QID) | ORAL | Status: DC | PRN

## 2016-09-15 MED ORDER — CEFEPIME-DEXTROSE 1 GM/50ML IV SOLR
1.0000 g | Freq: Two times a day (BID) | INTRAVENOUS | Status: DC
  Administered 2016-09-15 – 2016-09-18 (×7): 1 g via INTRAVENOUS
  Filled 2016-09-15 (×8): qty 50

## 2016-09-15 MED ORDER — ASPIRIN 81 MG PO CHEW
81.0000 mg | CHEWABLE_TABLET | Freq: Every day | ORAL | Status: DC
  Administered 2016-09-15 – 2016-09-19 (×5): 81 mg via ORAL
  Filled 2016-09-15 (×5): qty 1

## 2016-09-15 MED ORDER — AZITHROMYCIN 500 MG PO TABS
250.0000 mg | ORAL_TABLET | Freq: Every day | ORAL | Status: DC
  Administered 2016-09-15 – 2016-09-19 (×5): 250 mg via ORAL
  Filled 2016-09-15 (×5): qty 1

## 2016-09-15 MED ORDER — ACETAMINOPHEN 650 MG RE SUPP: 650.0000 mg | Freq: Four times a day (QID) | RECTAL | Status: DC | PRN

## 2016-09-15 MED ORDER — ALBUTEROL SULFATE (2.5 MG/3ML) 0.083% IN NEBU
5.0000 mg | INHALATION_SOLUTION | Freq: Once | RESPIRATORY_TRACT | Status: AC
  Administered 2016-09-15: 5 mg via RESPIRATORY_TRACT
  Filled 2016-09-15: qty 6

## 2016-09-15 MED ORDER — SODIUM CHLORIDE 0.9 % IV BOLUS (SEPSIS)
1000.0000 mL | Freq: Once | INTRAVENOUS | Status: AC
  Administered 2016-09-15: 1000 mL via INTRAVENOUS

## 2016-09-15 MED ORDER — IPRATROPIUM BROMIDE 0.02 % IN SOLN
0.5000 mg | Freq: Once | RESPIRATORY_TRACT | Status: AC
  Administered 2016-09-15: 0.5 mg via RESPIRATORY_TRACT
  Filled 2016-09-15: qty 2.5

## 2016-09-15 MED ORDER — PSEUDOEPHEDRINE HCL 30 MG PO TABS
30.0000 mg | ORAL_TABLET | Freq: Four times a day (QID) | ORAL | Status: DC | PRN
  Filled 2016-09-15: qty 1

## 2016-09-15 MED ORDER — ONDANSETRON HCL 4 MG PO TABS
4.0000 mg | ORAL_TABLET | Freq: Four times a day (QID) | ORAL | Status: DC | PRN
Start: 2016-09-15 — End: 2016-09-19

## 2016-09-15 MED ORDER — BENZONATATE 100 MG PO CAPS
100.0000 mg | ORAL_CAPSULE | Freq: Three times a day (TID) | ORAL | Status: DC
  Administered 2016-09-15 – 2016-09-19 (×12): 100 mg via ORAL
  Filled 2016-09-15 (×12): qty 1

## 2016-09-15 MED ORDER — SODIUM CHLORIDE 0.9% FLUSH: 3.0000 mL | Freq: Two times a day (BID) | INTRAVENOUS | Status: DC

## 2016-09-15 MED ORDER — SODIUM CHLORIDE 0.9 % IV SOLN: 250.0000 mL | INTRAVENOUS | Status: DC | PRN

## 2016-09-15 MED ORDER — ALBUTEROL SULFATE (2.5 MG/3ML) 0.083% IN NEBU
2.5000 mg | INHALATION_SOLUTION | RESPIRATORY_TRACT | Status: DC
  Administered 2016-09-15 – 2016-09-19 (×21): 2.5 mg via RESPIRATORY_TRACT
  Filled 2016-09-15 (×21): qty 3

## 2016-09-15 MED ORDER — ALBUTEROL SULFATE (2.5 MG/3ML) 0.083% IN NEBU: 2.5000 mg | INHALATION_SOLUTION | RESPIRATORY_TRACT | Status: DC | PRN

## 2016-09-15 MED ORDER — SENNOSIDES-DOCUSATE SODIUM 8.6-50 MG PO TABS: 1.0000 | ORAL_TABLET | Freq: Every evening | ORAL | Status: DC | PRN

## 2016-09-15 NOTE — ED Provider Notes (Addendum)
[facility] Emergency Department Provider Note  ____________________________________________   I have reviewed the triage vital signs and the nursing notes.   HISTORY  Chief Complaint Shortness of Breath    HPI Samantha Ho is a 61 y.o. female with a history of significant COPD and 2 L states that she was short of breath when she woke up this morning. She was found by EMS on her home 2 L with sats in the mid 70s. Her husband states that she just woke up feeling frantic that she could not breathe. She has had no chest pain, she was feeling better yesterday. She had recent steroids and antibiotics for possible pneumonia. Patient did try to follow up with pulmonology she states but they could not see her because of financial issues. The patient denies any leg swelling, she does not currently smoke. She states she was doing pretty good she has not had any recent cough or fever.  He received supplementary oxygen from EMS and feels somewhat better. She states she is pliable with her oxygen at home as well as with her nebulizers. Did run out of steroids recently after a recent breast   Past Medical History:  Diagnosis Date  . COPD (chronic obstructive pulmonary disease) [facility])     Patient Active Problem List   Diagnosis Date Noted  . Acute on chronic respiratory failure with hypoxia ([place]) 08/25/2016  . Emphysema lung ([place]) 08/25/2016  . Pneumonia 08/25/2016  . Cavitary lesion of lung 08/25/2016  . Severe protein-calorie malnutrition ([place]) 08/25/2016  . Pleuritic chest pain 08/25/2016  . Hypokalemia 08/25/2016  . Leukocytosis 08/25/2016  . Sepsis ([place]) 08/23/2016    History reviewed. No pertinent surgical history.  Prior to Admission medications   Medication Sig Start Date End Date Taking? Authorizing Provider  albuterol (PROVENTIL) (2.5 MG/3ML) 0.083% nebulizer solution Take 3 mLs (2.5 mg total) by nebulization every 4 (four) hours. 08/25/16   Theodoro Grist,  MD  amoxicillin-clavulanate (AUGMENTIN) 875-125 MG tablet Take 1 tablet by mouth every 12 (twelve) hours. 08/25/16   Theodoro Grist, MD  Aspirin-Acetaminophen-Caffeine (GOODY HEADACHE PO) Take 1 packet by mouth as needed (headache).    Historical Provider, MD  benzonatate (TESSALON) 100 MG capsule Take 1 capsule (100 mg total) by mouth 3 (three) times daily. 08/25/16   Theodoro Grist, MD  budesonide-formoterol (SYMBICORT) 160-4.5 MCG/ACT inhaler Inhale 2 puffs into the lungs 2 (two) times daily. 08/25/16   Theodoro Grist, MD  guaiFENesin (MUCINEX) 600 MG 12 hr tablet Take 1 tablet (600 mg total) by mouth 2 (two) times daily. 08/25/16   Theodoro Grist, MD  ipratropium (ATROVENT HFA) 17 MCG/ACT inhaler Inhale 2 puffs into the lungs every 6 (six) hours as needed for wheezing.    Historical Provider, MD  predniSONE (STERAPRED UNI-PAK 21 TAB) 10 MG (21) TBPK tablet Please take 6 pills in the morning on the day 1, then taper by one pill daily until finished, thank you 08/25/16   Theodoro Grist, MD    Allergies Patient has no known allergies.  History reviewed. No pertinent family history.  Social History Social History  Substance Use Topics  . Smoking status: Former Smoker    Packs/day: 3.00    Types: Cigarettes    Quit date: 08/09/2016  . Smokeless tobacco: Never Used  . Alcohol use No    Review of Systems Constitutional: No fever/chills Eyes: No visual changes. ENT: No sore throat. No stiff neck no neck pain Cardiovascular: Denies chest pain. Respiratory: Positive shortness  of breath. Gastrointestinal:   no vomiting.  No diarrhea.  No constipation. Genitourinary: Negative for dysuria. Musculoskeletal: Negative lower extremity swelling Skin: Negative for rash. Neurological: Negative for severe headaches, focal weakness or numbness. 10-point ROS otherwise negative.  ____________________________________________   PHYSICAL EXAM:  VITAL SIGNS: ED Triage Vitals  Enc Vitals Group     BP 09/15/16  0915 129/70     Pulse Rate 09/15/16 0915 98     Resp 09/15/16 0915 (!) 24     Temp 09/15/16 0915 98.2 F (36.8 C)     Temp Source 09/15/16 0915 Oral     SpO2 09/15/16 0915 100 %     Weight 09/15/16 0916 82 lb (37.2 kg)     Height 09/15/16 0916 5\' 2"  (1.575 m)     Head Circumference --      Peak Flow --      Pain Score 09/15/16 0916 0     Pain Loc --      Pain Edu? --      Excl. in [place]? --     Constitutional: Alert and oriented. Well appearing and in no acute distress. Eyes: Conjunctivae are normal. PERRL. EOMI. Head: Atraumatic. Nose: No congestion/rhinnorhea. Mouth/Throat: Mucous membranes are moist.  Oropharynx non-erythematous. Neck: No stridor.   Nontender with no meningismus Cardiovascular: Normal rate, regular rhythm. Grossly normal heart sounds.  Good peripheral circulation. Respiratory: Normal respiratory effort.  No retractions. 6 in full sentences, patient is diminished bilateral bases with occasional slight wheeze no rales or rhonchi Abdominal: Soft and nontender. No distention. No guarding no rebound Back:  There is no focal tenderness or step off.  there is no midline tenderness there are no lesions noted. there is no CVA tenderness Musculoskeletal: No lower extremity tenderness, no upper extremity tenderness. No joint effusions, no DVT signs strong distal pulses no edema Neurologic:  Normal speech and language. No gross focal neurologic deficits are appreciated.  Skin:  Skin is warm, dry and intact. There is some abrasion noted to the right elbow excoriations noted to the hand, poor skin turgor noted Psychiatric: Mood and affect are normal. Speech and behavior are normal.  ____________________________________________   LABS (all labs ordered are listed, but only abnormal results are displayed)  Labs Reviewed  BLOOD GAS, ARTERIAL - Abnormal; Notable for the following:       Result Value   pCO2 arterial 50 (*)    pO2, Arterial 216 (*)    Bicarbonate 31.0 (*)     Acid-Base Excess 5.2 (*)    All other components within normal limits  TROPONIN I  BRAIN NATRIURETIC PEPTIDE  CBC WITH DIFFERENTIAL/PLATELET  COMPREHENSIVE METABOLIC PANEL  ETHANOL   ____________________________________________  EKG  I personally interpreted any EKGs ordered by me or triage Normal sinus tach rate 101, no acute ST elevation or depression normal axis R SR prime configuration noted. ____________________________________________  [id number]  I reviewed any imaging ordered by me or triage that were performed during my shift and, if possible, patient and/or family made aware of any abnormal findings. ____________________________________________   PROCEDURES  Procedure(s) performed: None  Procedures  Critical Care performed: None  ____________________________________________   INITIAL IMPRESSION / ASSESSMENT AND PLAN / ED COURSE  Pertinent labs & imaging results that were available during my care of the patient were reviewed by me and considered in my medical decision making (see chart for details).  Patient who is hypoxic on her home oxygen presents today with shortness of breath. She denies any  other alleviating or aggravating or complicating symptoms such as chest pain fever cough etc. She is significantly impaired by COPD. We will obtain chest x-ray. Blood work. We'll evaluate for CHF and for ischemic heart disease although low suspicion. We will give her nebulizers I will hold off on steroids that she is looking okay right now. We did an ABG to see if she was retaining.  ----------------------------------------- 11:46 AM on 09/15/2016 -----------------------------------------  Were some readings of lower blood pressure here however, patient is very slender and I suspect the cuff did not completely fit. Nonetheless we are giving her IV fluids. Her white count is elevated but she just finished steroids. CT scan results are just back at this time. This is after  admission to the hospitalist. He had discussed and he will likely start her on antibiotics at this time. At this time she is under hospitalist care patient is doing much better after nebulizer and she is down to 2 L nasal cannula but there is certainly a significant amount of other pathology that needs to be addressed and explored.    ____________________________________________   FINAL CLINICAL IMPRESSION(S) / ED DIAGNOSES  Final diagnoses:  None      This chart was dictated using voice recognition software.  Despite best efforts to proofread,  errors can occur which can change meaning.      Schuyler Amor, MD 09/15/16 [place], MD 09/15/16 [phone]

## 2016-09-15 NOTE — ED Notes (Signed)
Pt transported to CT. Dr. Burlene Arnt ok to go to CT then start fluid and re-check BP manually.

## 2016-09-15 NOTE — H&P (Signed)
[facility] NAME: Samantha Ho    MR#:  [id number]  DATE OF BIRTH:  1955-11-24  DATE OF ADMISSION:  09/15/2016  PRIMARY CARE PHYSICIAN: Pcp Not In System   REQUESTING/REFERRING PHYSICIAN: Schuyler Amor, MD  CHIEF COMPLAINT:   Chief Complaint  Patient presents with  . Shortness of Breath   Worsening shortness of breath with morning. HISTORY OF PRESENT ILLNESS:  Samantha Ho  is a 61 y.o. female with a known history of Recent diagnosis of COPD, emphysema, cavitary lesion of lung and pneumonia. The patient was treated for acute hypoxic respiratory failure secondary to COPD exacerbation and pneumonia. She was treated with antibiotics and steroid, discharged with home oxygen 2 L. She had very severe shortness of breath when she woke up this morning. Her oxygen level was 70s on her home oxygen. She denies any fever or chills, no chest pain no palpitation or leg swelling. She was treated with nebulizer and IV steroids in the ED. Chest x-ray and CAT scan of the chest show cavitary pneumonia.  PAST MEDICAL HISTORY:   Past Medical History:  Diagnosis Date  . COPD (chronic obstructive pulmonary disease) ([place])     PAST SURGICAL HISTORY:  History reviewed. No pertinent surgical history.  SOCIAL HISTORY:   Social History  Substance Use Topics  . Smoking status: Former Smoker    Packs/day: 3.00    Years: 45.00    Types: Cigarettes    Quit date: 08/09/2016  . Smokeless tobacco: Never Used  . Alcohol use No    FAMILY HISTORY:   Family History  Problem Relation Age of Onset  . Asthma Mother   . COPD Father     DRUG ALLERGIES:  No Known Allergies  REVIEW OF SYSTEMS:   Review of Systems  Constitutional: Positive for malaise/fatigue. Negative for chills, fever and weight loss.  HENT: Negative for sinus pain and sore throat.   Eyes: Negative for blurred vision and double vision.  Respiratory: Positive for cough,  shortness of breath and wheezing. Negative for hemoptysis, sputum production and stridor.   Cardiovascular: Negative for chest pain, palpitations and leg swelling.  Gastrointestinal: Negative for abdominal pain, blood in stool, diarrhea, melena, nausea and vomiting.  Genitourinary: Negative for dysuria and hematuria.  Musculoskeletal: Negative for back pain.  Skin: Negative for itching and rash.  Neurological: Positive for weakness. Negative for dizziness, focal weakness, loss of consciousness and headaches.  Psychiatric/Behavioral: Negative for depression. The patient is not nervous/anxious.     MEDICATIONS AT HOME:   Prior to Admission medications   Medication Sig Start Date End Date Taking? Authorizing Provider  albuterol (PROVENTIL) (2.5 MG/3ML) 0.083% nebulizer solution Take 3 mLs (2.5 mg total) by nebulization every 4 (four) hours. 08/25/16  Yes Theodoro Grist, MD  Aspirin-Acetaminophen-Caffeine (GOODY HEADACHE PO) Take 1 packet by mouth as needed (headache).   Yes Historical Provider, MD  budesonide-formoterol (SYMBICORT) 160-4.5 MCG/ACT inhaler Inhale 2 puffs into the lungs 2 (two) times daily. 08/25/16  Yes Theodoro Grist, MD  guaiFENesin (MUCINEX) 600 MG 12 hr tablet Take 1 tablet (600 mg total) by mouth 2 (two) times daily. 08/25/16  Yes Theodoro Grist, MD  ipratropium (ATROVENT HFA) 17 MCG/ACT inhaler Inhale 2 puffs into the lungs every 6 (six) hours as needed for wheezing.   Yes Historical Provider, MD  pseudoephedrine (SUDAFED) 30 MG tablet Take 30 mg by mouth every 4 (four) hours as needed for congestion.   Yes Historical  Provider, MD  amoxicillin-clavulanate (AUGMENTIN) 875-125 MG tablet Take 1 tablet by mouth every 12 (twelve) hours. Patient not taking: Reported on 09/15/2016 08/25/16   Theodoro Grist, MD  benzonatate (TESSALON) 100 MG capsule Take 1 capsule (100 mg total) by mouth 3 (three) times daily. Patient not taking: Reported on 09/15/2016 08/25/16   Theodoro Grist, MD  predniSONE  (STERAPRED UNI-PAK 21 TAB) 10 MG (21) TBPK tablet Please take 6 pills in the morning on the day 1, then taper by one pill daily until finished, thank you Patient not taking: Reported on 09/15/2016 08/25/16   Theodoro Grist, MD  Vitamin D, Ergocalciferol, (DRISDOL) 50000 units CAPS capsule Take 50,000 Units by mouth every 7 (seven) days. 08/12/16   Historical Provider, MD      VITAL SIGNS:  Blood pressure 104/69, pulse 93, temperature 98.2 F (36.8 C), temperature source Oral, resp. rate (!) 23, height 5\' 2"  (1.575 m), weight 82 lb (37.2 kg), SpO2 97 %.  PHYSICAL EXAMINATION:  Physical Exam  GENERAL:  61 y.o.-year-old patient lying in the bed with no acute distress. Severe malnutrition. EYES: Pupils equal, round, reactive to light and accommodation. No scleral icterus. Extraocular muscles intact.  HEENT: Head atraumatic, normocephalic. Oropharynx and nasopharynx clear.  NECK:  Supple, no jugular venous distention. No thyroid enlargement, no tenderness.  LUNGS: Very diminished breath sounds bilaterally, no wheezing, rales,rhonchi or crepitation. No use of accessory muscles of respiration.  CARDIOVASCULAR: S1, S2 normal. No murmurs, rubs, or gallops.  ABDOMEN: Soft, nontender, nondistended. Bowel sounds present. No organomegaly or mass.  EXTREMITIES: No pedal edema, cyanosis, or clubbing.  NEUROLOGIC: Cranial nerves II through XII are intact. Muscle strength 5/5 in all extremities. Sensation intact. Gait not checked.  PSYCHIATRIC: The patient is alert and oriented x 3.  SKIN: No obvious rash, lesion, or ulcer.   LABORATORY PANEL:   CBC  Recent Labs Lab 09/15/16 0926  WBC 18.3*  HGB 11.4*  HCT 35.0  PLT 703*   ------------------------------------------------------------------------------------------------------------------  Chemistries   Recent Labs Lab 09/15/16 0926  NA 133*  K 4.4  CL 97*  CO2 25  GLUCOSE 103*  BUN 11  CREATININE 0.30*  CALCIUM 8.3*  AST 22  ALT 17    ALKPHOS 105  BILITOT 0.3   ------------------------------------------------------------------------------------------------------------------  Cardiac Enzymes  Recent Labs Lab 09/15/16 0926  TROPONINI 0.08*   ------------------------------------------------------------------------------------------------------------------  RADIOLOGY:  Dg Chest 2 View  Result Date: 09/15/2016 CLINICAL DATA:  Shortness of Breath EXAM: CHEST  2 VIEW COMPARISON:  Chest radiograph and chest CT August 23, 2016 FINDINGS: There is extensive underlying emphysematous change. There is fibrotic type change throughout the mid and lower lung zones without appreciable change from studies 3.5 weeks earlier. Asymmetric pleural thickening noted on the left. On the frontal view, there is an air-fluid level on the right slightly inferior to the carina, not appreciable on recent radiographic study. Heart is small consistent with underlying emphysema. Distortion of the pulmonary vascularity is stable. No adenopathy is appreciable by radiography. No bone lesions. IMPRESSION: Underlying emphysematous change with widespread cicatrization and fibrotic type change. Air-fluid level on the right medially slightly inferior to the carina level, not appreciable on recent prior studies. Question developing cavitary lesion in this area. Given an apparent change from recent prior study, correlation with noncontrast enhanced chest CT felt to be advisable. Stable cardiac silhouette. Distortion of pulmonary vascularity is stable. No appreciable adenopathy by radiography noted. Electronically Signed   By: Lowella Grip III M.D.   On: 09/15/2016  10:09   Ct Chest Wo Contrast  Result Date: 09/15/2016 CLINICAL DATA:  61 year old female with pneumonia EXAM: CT CHEST WITHOUT CONTRAST TECHNIQUE: Multidetector CT imaging of the chest was performed following the standard protocol without IV contrast. COMPARISON:  Chest x-ray 08/23/2016 and 09/15/2016.  Prior contrast-enhanced CT 08/23/2016 FINDINGS: Cardiovascular: Heart size unchanged. Minimal calcifications of the thoracic aorta. Calcifications of left main, left anterior descending coronary arteries. Note that the vasculature is not well evaluated given the exclusion of contrast. Mediastinum/Nodes: Multiple small lymph nodes throughout the mediastinum. None of these are enlarged. Left axillary lymph nodes borderline enlarged, similar to the prior CT. Small right axillary lymph nodes. Small supraclavicular lymph nodes. Lungs/Pleura: Re- demonstration of advanced paraseptal and centrilobular emphysema with expansion of the lungs, flattened hemidiaphragms, increased retrosternal airspace, increased AP diameter of the chest. Architectural distortion scarring involves all of the lung lobes, including bilateral apical pleuroparenchymal thickening and thickening of the fissures. On the right there has been progression of confluent nodular opacity involving the superior segments of the left lower lobe which are expanded. Cavitary changes involve the superior medial segments. Air-fluid level on the recent chest x-ray is enlarged compared to the prior CT, involving the medial segments. Partially resolved ground-glass and nodular opacities at the periphery of the extreme bilateral lung bases and posterior right base. Focal nodule at the periphery of the left lower lobe measures 12 mm, unchanged from prior. Re- demonstration of elongated air cavity at the posterior left lung extending from the apex inferiorly below the hilum. No vasculature are bronchi are visualized to cross the space which has a thick rim circumferentially. Upper Abdomen: Aortic atherosclerosis. Musculoskeletal: No displaced fracture.  Osteopenia. IMPRESSION: Mixed response of multifocal airspace opacities, suggesting only partial clearing of multifocal pneumonia. The bilateral lung bases are most improved, with increasing consolidation and cavitary  changes involving the superior and medial segments of the right lower lobe. Increasing cavitation of the superior medial segments of the right lower lobe with air-fluid level, accounting for findings on today's chest x-ray. This is favored to represent cavitating pneumonia. Associated empyema not excluded. 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 Pleuroparenchymal thickening at the bilateral lung apices with associated architectural distortion and thickening of the fissures and pleurae bilaterally. In addition, there is a 12 mm nodule at the left lung base. Neoplasm not excluded at any of these location Re- demonstration of extensive emphysema bilaterally. Aortic atherosclerosis and coronary artery disease. Borderline enlarged lymph nodes at the left axillary region, potentially reactive. Electronically Signed   By: Corrie Mckusick D.O.   On: 09/15/2016 11:33      IMPRESSION AND PLAN:   Acute on chronic respiratory failure due to COPD exacerbation and PNA. The patient will be admitted to medical floor. Continue oxygen by nasal cannular, nebulizer every 4 hours, start IV Solu-Medrol, Robitussin when necessary.  Cavitary pneumonia. Possible due to residual pneumonia or HAP. Start cefepime and vancomycin pharmacy to dose. Follow-up cultures and  pulmonary consult.  Leukocytosis. Possible due to pneumonia or steroid. Follow-up CBC.  Hypotension. Treated with normal saline bolus, continue normal saline IV.   Hyponatremia. Continue normal saline and follow-up BMP.  Elevated troponin. Possible due to demanding ischemia. Follow-up troponin level. Continue aspirin.   Severe malnutrition. Dietitian consult.  All the records are reviewed and case discussed with ED provider. Management plans discussed with the patient, her husband and  they are in agreement.  CODE STATUS: full  code.  TOTAL TIME TAKING CARE OF THIS PATIENT: 56 minutes.    Demetrios Loll M.D on 09/15/2016 at 12:23 PM  Between 7am to 6pm - Pager - [phone]  After 6pm go to [email or web address] - Proofreader  [facility]  Office  [phone]  CC: Primary care physician; Pcp Not In System   Note: This dictation was prepared with Dragon dictation along with smaller phrase technology. Any transcriptional errors that result from this process are unintentional.

## 2016-09-15 NOTE — Progress Notes (Signed)
Pharmacy Antibiotic Note  Samantha Ho is a 61 y.o. female admitted on 09/15/2016 with Cavitary  pneumonia.  Pharmacy has been consulted for Vancomycin dosing. Patient is also receiving cefepime and azithromycin .  Plan: Ke: 0.042  Vd: 26    t1/2: 17  Will give vancomycin 1gm IV x 1 dose, followed by vancomycin 500mg  IV every 18 hours. Calculated trough at Css if 17. Trough level ordered prior to 4th dose. Will monitor renal function and adjust dose as needed.   Height: 5\' 2"  (157.5 cm) Weight: 82 lb (37.2 kg) IBW/kg (Calculated) : 50.1  Temp (24hrs), Avg:98.2 F (36.8 C), Min:98.2 F (36.8 C), Max:98.2 F (36.8 C)   Recent Labs Lab 09/15/16 0926  WBC 18.3*  CREATININE 0.30*    Estimated Creatinine Clearance: 43.9 mL/min (A) (by C-G formula based on SCr of 0.3 mg/dL (L)).    No Known Allergies  Antimicrobials this admission: 3/24 cefepime >  3/24 vancomycin>> 3/24 azithromycin>>   Dose adjustments this admission:  Microbiology results: 3/24  BCx: sent 3/24 Sputum: sent  Thank you for allowing pharmacy to be a part of this patient's care.  Pernell Dupre, PharmD, BCPS Clinical Pharmacist 09/15/2016 1:05 PM

## 2016-09-15 NOTE — ED Triage Notes (Addendum)
Pt arrived via EMS from home with reports of shortness of breath. Pt admitted to the hospital on 3/1 was supposed to be seen by Pulmonology but unable to pay copay. Pt was 78% on room air when the medic arrived, up to 100 % on NRB 15L. Pt wears home oxygen 2L continuous.  Pt is a former smoker, denies any chest pain, n/v/d.

## 2016-09-16 LAB — CBC
HCT: 33.7 % — ABNORMAL LOW (ref 35.0–47.0)
Hemoglobin: 11 g/dL — ABNORMAL LOW (ref 12.0–16.0)
MCH: 27.1 pg (ref 26.0–34.0)
MCHC: 32.6 g/dL (ref 32.0–36.0)
MCV: 83.1 fL (ref 80.0–100.0)
PLATELETS: 693 10*3/uL — AB (ref 150–440)
RBC: 4.06 MIL/uL (ref 3.80–5.20)
RDW: 14.5 % (ref 11.5–14.5)
WBC: 8.7 10*3/uL (ref 3.6–11.0)

## 2016-09-16 LAB — BASIC METABOLIC PANEL
Anion gap: 9 (ref 5–15)
BUN: 11 mg/dL (ref 6–20)
CHLORIDE: 97 mmol/L — AB (ref 101–111)
CO2: 28 mmol/L (ref 22–32)
Calcium: 8.4 mg/dL — ABNORMAL LOW (ref 8.9–10.3)
GLUCOSE: 149 mg/dL — AB (ref 65–99)
Potassium: 3.3 mmol/L — ABNORMAL LOW (ref 3.5–5.1)
SODIUM: 134 mmol/L — AB (ref 135–145)

## 2016-09-16 LAB — TROPONIN I: Troponin I: 0.19 ng/mL (ref <0.03)

## 2016-09-16 LAB — HIV ANTIBODY (ROUTINE TESTING W REFLEX): HIV Screen 4th Generation wRfx: NONREACTIVE

## 2016-09-16 MED ORDER — ADULT MULTIVITAMIN W/MINERALS CH
1.0000 | ORAL_TABLET | Freq: Every day | ORAL | Status: DC
  Administered 2016-09-16 – 2016-09-19 (×4): 1 via ORAL
  Filled 2016-09-16 (×4): qty 1

## 2016-09-16 MED ORDER — POTASSIUM CHLORIDE CRYS ER 20 MEQ PO TBCR
40.0000 meq | EXTENDED_RELEASE_TABLET | Freq: Once | ORAL | Status: AC
Start: 2016-09-16 — End: 2016-09-16
  Administered 2016-09-16: 40 meq via ORAL
  Filled 2016-09-16: qty 2

## 2016-09-16 MED ORDER — ALUM & MAG HYDROXIDE-SIMETH 200-200-20 MG/5ML PO SUSP
30.0000 mL | Freq: Four times a day (QID) | ORAL | Status: DC | PRN
  Filled 2016-09-16: qty 30

## 2016-09-16 MED ORDER — ENSURE ENLIVE PO LIQD
237.0000 mL | Freq: Two times a day (BID) | ORAL | Status: DC
  Administered 2016-09-16 – 2016-09-18 (×4): 237 mL via ORAL

## 2016-09-16 NOTE — Plan of Care (Signed)
Problem: Safety: Goal: Ability to remain free from injury will improve Outcome: Progressing Pt. VS stable. Adhered to all medication regimens. Lung sounds improved after the first breathing treatment today. Was able to get up and use the bedside commode by self.

## 2016-09-16 NOTE — Progress Notes (Signed)
Initial Nutrition Assessment  DOCUMENTATION CODES:   Severe malnutrition in context of chronic illness, Underweight  INTERVENTION:  Provide Ensure Enlive po BID, each supplement provides 350 kcal and 20 grams of protein. Patient prefers chocolate.  Provide multivitamin with minerals daily.  Discussed patient's increased needs for calories and protein due to COPD and emphysema. Discussed that this is what likely caused patient's loss of body weight and muscle mass. Encouraged intake of milk 2-3 times daily with meals and also encouraged snacks between meals. Patient refused formal handout on high-calorie, high-protein nutrition therapy.  NUTRITION DIAGNOSIS:   Malnutrition (Severe) related to chronic illness (COPD, emphysema) as evidenced by severe depletion of body fat, severe depletion of muscle mass.  GOAL:   Patient will meet greater than or equal to 90% of their needs  MONITOR:   PO intake, Supplement acceptance, Labs, Weight trends, I & O's  REASON FOR ASSESSMENT:   Consult Assessment of nutrition requirement/status  ASSESSMENT:   61 year old female with recent diagnosis of COPD, emphysema presents with SOB and found to have COPD exacerbation and cavitary PNA.   Spoke with patient at bedside. She reports her appetite is good now and has always been good. She denies any N/V or abdominal pain. She reports that she used to only eat whenever she felt hungry, but that for the past 2 months she has been having 3 meals daily. Patient unable to provide details on intake. She only reports having "breakfast food" at breakfast and does report having protein at other meals.   UBW was 100 lbs 2 years ago. Patient reports she lost weight and muscle mass when she retired from work. She reports by eating more regularly over the past 2 months she has gained 2 lbs back. Per chart patient was 82 lbs on 3/1 and has lost 3 lbs (3.7% body weight) over 3 weeks, which is not significant for time  frame.  Meal Completion: 100% of dinner last night (828 kcal and 42 grams of protein)  Medications reviewed and include: azithromycin, methylprednisolone 40 mg Q12hrs, vancomycin, NS @ 75 ml/hr.  Labs reviewed: Sodium 134, Potassium 3.3, Chloride 97, Creatinine <0.3, elevated Troponin.   Nutrition-Focused physical exam completed. Findings are severe fat depletion, severe muscle depletion, and no edema.   Diet Order:  Diet regular Room service appropriate? Yes; Fluid consistency: Thin  Skin:  Reviewed, no issues  Last BM:  09/15/2016   Height:   Ht Readings from Last 1 Encounters:  09/15/16 5\' 2"  (1.575 m)    Weight:   Wt Readings from Last 1 Encounters:  09/15/16 79 lb 4.8 oz (36 kg)    Ideal Body Weight:  50 kg  BMI:  Body mass index is 14.5 kg/m.  Estimated Nutritional Needs:   Kcal:  1260-1440 (35-40 kcal/kg)  Protein:  55-70 grams (1.5-2 grams/kg)  Fluid:  1-1.2 L/day  EDUCATION NEEDS:   Education needs addressed  Willey Blade, MS, RD, LDN Pager: [phone] After Hours Pager: [phone]

## 2016-09-16 NOTE — Progress Notes (Signed)
[facility] [place] NAME: Samantha Ho    MR#:  [id number]  DATE OF BIRTH:  1956/05/25  SUBJECTIVE:   Patient here with SOB and cough  REVIEW OF SYSTEMS:    Review of Systems  Constitutional: Negative.  Negative for chills, fever and malaise/fatigue.  HENT: Negative.  Negative for ear discharge, ear pain, hearing loss, nosebleeds and sore throat.   Eyes: Negative.  Negative for blurred vision and pain.  Respiratory: Positive for cough and shortness of breath. Negative for hemoptysis and wheezing.   Cardiovascular: Negative.  Negative for chest pain, palpitations and leg swelling.  Gastrointestinal: Negative.  Negative for abdominal pain, blood in stool, diarrhea, nausea and vomiting.  Genitourinary: Negative.  Negative for dysuria.  Musculoskeletal: Negative.  Negative for back pain.  Skin: Negative.   Neurological: Negative for dizziness, tremors, speech change, focal weakness, seizures and headaches.  Endo/Heme/Allergies: Negative.  Does not bruise/bleed easily.  Psychiatric/Behavioral: Negative.  Negative for depression, hallucinations and suicidal ideas.    Tolerating Diet: yes      DRUG ALLERGIES:  No Known Allergies  VITALS:  Blood pressure 134/70, pulse 81, temperature 98.3 F (36.8 C), temperature source Oral, resp. rate 16, height 5\' 2"  (1.575 m), weight 36 kg (79 lb 4.8 oz), SpO2 95 %.  PHYSICAL EXAMINATION:   Physical Exam  Constitutional: She is oriented to person, place, and time and well-developed, well-nourished, and in no distress. No distress.  HENT:  Head: Normocephalic.  Eyes: No scleral icterus.  Neck: Normal range of motion. Neck supple. No JVD present. No tracheal deviation present.  Cardiovascular: Normal rate, regular rhythm and normal heart sounds.  Exam reveals no gallop and no friction rub.   No murmur heard. Pulmonary/Chest: Effort normal and breath sounds normal. No respiratory distress. She  has no wheezes. She has no rales. She exhibits no tenderness.  Sounds tight  Abdominal: Soft. Bowel sounds are normal. She exhibits no distension and no mass. There is no tenderness. There is no rebound and no guarding.  Musculoskeletal: Normal range of motion. She exhibits no edema.  Neurological: She is alert and oriented to person, place, and time.  Skin: Skin is warm. No rash noted. No erythema.  Psychiatric: Affect and judgment normal.      LABORATORY PANEL:   CBC  Recent Labs Lab 09/16/16 0326  WBC 8.7  HGB 11.0*  HCT 33.7*  PLT 693*   ------------------------------------------------------------------------------------------------------------------  Chemistries   Recent Labs Lab 09/15/16 0926 09/15/16 1337 09/16/16 0326  NA 133*  --  134*  K 4.4  --  3.3*  CL 97*  --  97*  CO2 25  --  28  GLUCOSE 103*  --  149*  BUN 11  --  11  CREATININE 0.30*  --  <0.30*  CALCIUM 8.3*  --  8.4*  MG  --  1.8  --   AST 22  --   --   ALT 17  --   --   ALKPHOS 105  --   --   BILITOT 0.3  --   --    ------------------------------------------------------------------------------------------------------------------  Cardiac Enzymes  Recent Labs Lab 09/15/16 0926 09/15/16 1631 09/16/16 0326  TROPONINI 0.08* 0.28* 0.19*   ------------------------------------------------------------------------------------------------------------------  RADIOLOGY:  Dg Chest 2 View  Result Date: 09/15/2016 CLINICAL DATA:  Shortness of Breath EXAM: CHEST  2 VIEW COMPARISON:  Chest radiograph and chest CT August 23, 2016 FINDINGS: There is extensive underlying emphysematous change. There  is fibrotic type change throughout the mid and lower lung zones without appreciable change from studies 3.5 weeks earlier. Asymmetric pleural thickening noted on the left. On the frontal view, there is an air-fluid level on the right slightly inferior to the carina, not appreciable on recent radiographic study.  Heart is small consistent with underlying emphysema. Distortion of the pulmonary vascularity is stable. No adenopathy is appreciable by radiography. No bone lesions. IMPRESSION: Underlying emphysematous change with widespread cicatrization and fibrotic type change. Air-fluid level on the right medially slightly inferior to the carina level, not appreciable on recent prior studies. Question developing cavitary lesion in this area. Given an apparent change from recent prior study, correlation with noncontrast enhanced chest CT felt to be advisable. Stable cardiac silhouette. Distortion of pulmonary vascularity is stable. No appreciable adenopathy by radiography noted. Electronically Signed   By: Lowella Grip III M.D.   On: 09/15/2016 10:09   Ct Chest Wo Contrast  Result Date: 09/15/2016 CLINICAL DATA:  61 year old female with pneumonia EXAM: CT CHEST WITHOUT CONTRAST TECHNIQUE: Multidetector CT imaging of the chest was performed following the standard protocol without IV contrast. COMPARISON:  Chest x-ray 08/23/2016 and 09/15/2016. Prior contrast-enhanced CT 08/23/2016 FINDINGS: Cardiovascular: Heart size unchanged. Minimal calcifications of the thoracic aorta. Calcifications of left main, left anterior descending coronary arteries. Note that the vasculature is not well evaluated given the exclusion of contrast. Mediastinum/Nodes: Multiple small lymph nodes throughout the mediastinum. None of these are enlarged. Left axillary lymph nodes borderline enlarged, similar to the prior CT. Small right axillary lymph nodes. Small supraclavicular lymph nodes. Lungs/Pleura: Re- demonstration of advanced paraseptal and centrilobular emphysema with expansion of the lungs, flattened hemidiaphragms, increased retrosternal airspace, increased AP diameter of the chest. Architectural distortion scarring involves all of the lung lobes, including bilateral apical pleuroparenchymal thickening and thickening of the fissures. On  the right there has been progression of confluent nodular opacity involving the superior segments of the left lower lobe which are expanded. Cavitary changes involve the superior medial segments. Air-fluid level on the recent chest x-ray is enlarged compared to the prior CT, involving the medial segments. Partially resolved ground-glass and nodular opacities at the periphery of the extreme bilateral lung bases and posterior right base. Focal nodule at the periphery of the left lower lobe measures 12 mm, unchanged from prior. Re- demonstration of elongated air cavity at the posterior left lung extending from the apex inferiorly below the hilum. No vasculature are bronchi are visualized to cross the space which has a thick rim circumferentially. Upper Abdomen: Aortic atherosclerosis. Musculoskeletal: No displaced fracture.  Osteopenia. IMPRESSION: Mixed response of multifocal airspace opacities, suggesting only partial clearing of multifocal pneumonia. The bilateral lung bases are most improved, with increasing consolidation and cavitary changes involving the superior and medial segments of the right lower lobe. Increasing cavitation of the superior medial segments of the right lower lobe with air-fluid level, accounting for findings on today's chest x-ray. This is favored to represent cavitating pneumonia. Associated empyema not excluded. 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 Pleuroparenchymal thickening at the bilateral lung apices with associated architectural distortion and thickening of the fissures and pleurae bilaterally. In addition, there is a 12 mm nodule at the left lung base. Neoplasm not excluded at any of these location Re- demonstration of extensive emphysema bilaterally. Aortic atherosclerosis and coronary artery disease. Borderline enlarged lymph nodes at the left  axillary  region, potentially reactive. Electronically Signed   By: Corrie Mckusick D.O.   On: 09/15/2016 11:33     ASSESSMENT AND PLAN:   61 y/o Female recently discharged from the hospital cavitary pneumonia presents again with shortness of breath.  1. Acute on chronic respiratory failure, hypoxic due to COPD exacerbation and the Terry PNA: Continue cefepime and vancomycin Continue current dose of IV steroids Wean oxygen as tolerated, patient was discharged with one and half liters oxygen Continue duo nebs and inhalers Pulmonary consultation, last hospital stay bronchoscopy was not offered due to high risk procedure  2. Hyponatremia improved  3. Hypokalemia: Replete and recheck in a.m.  4. Leukocytosis: Improved with treatment as stated above  5. Elevated troponin due to demand ischemia and not ACS    Management plans discussed with the patient and she is in agreement.  CODE STATUS: full  TOTAL TIME TAKING CARE OF THIS PATIENT: 30 minutes.     POSSIBLE D/C 2-3 days, DEPENDING ON CLINICAL CONDITION.   Danel Requena M.D on 09/16/2016 at 9:55 AM  Between 7am to 6pm - Pager - [phone] After 6pm go to [email or web address] - password [email or web address] [facility]  Office  [phone]  CC: Primary care physician; Pcp Not In System  Note: This dictation was prepared with Dragon dictation along with smaller phrase technology. Any transcriptional errors that result from this process are unintentional.

## 2016-09-17 LAB — EXPECTORATED SPUTUM ASSESSMENT W REFEX TO RESP CULTURE

## 2016-09-17 LAB — EXPECTORATED SPUTUM ASSESSMENT W GRAM STAIN, RFLX TO RESP C

## 2016-09-17 LAB — BASIC METABOLIC PANEL
ANION GAP: 8 (ref 5–15)
BUN: 8 mg/dL (ref 6–20)
CO2: 30 mmol/L (ref 22–32)
Calcium: 8.8 mg/dL — ABNORMAL LOW (ref 8.9–10.3)
Chloride: 100 mmol/L — ABNORMAL LOW (ref 101–111)
Glucose, Bld: 128 mg/dL — ABNORMAL HIGH (ref 65–99)
Potassium: 3.9 mmol/L (ref 3.5–5.1)
SODIUM: 138 mmol/L (ref 135–145)

## 2016-09-17 LAB — MRSA PCR SCREENING: MRSA by PCR: NEGATIVE

## 2016-09-17 MED ORDER — PREDNISONE 50 MG PO TABS
50.0000 mg | ORAL_TABLET | Freq: Every day | ORAL | Status: DC
  Administered 2016-09-17 – 2016-09-19 (×3): 50 mg via ORAL
  Filled 2016-09-17 (×3): qty 1

## 2016-09-17 NOTE — Consult Note (Signed)
Pulmonary Critical Care  Initial Consult Note   Zoei Amison WNI:[id number] DOB: Jan 11, 1956 DOA: 09/15/2016  Referring physician: Demetrios Loll, MD PCP: Pcp Not In System   Chief Complaint:  Shortness of breath  HPI: Serita Degroote is a 61 y.o. female  With  Prior history of severe COPD.  She was recently admitted to the hospital with COPD exacerbation cavitary pneumonia.  She was discharged and was to follow up in the office.  Apparently she did not make it to the office she came back to the hospital with increasing shortness of breath.  At the time of admission she was noted to have saturations in the 70s.  She was started on aggressive nebulizer therapy as well as steroids however did not improve and so therefore she was admitted to the hospital.  CT scan of the chest was done which again showed cavitary pneumonitis. She apparently followed up in [facility] with was supposed to see pulmonary over there but in review of the chart do not see any notes from pulmonary medicine over there.  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  She has very severe bullous disease and certainly any kind of invasive diagnostic testing would be highly risky in her.   Review of Systems:  Constitutional:  +weight loss, no night sweats, Fevers, chills, +fatigue.  HEENT:  No headaches, nasal congestion, post nasal drip,  Cardio-vascular:  No chest pain,  dizziness, palpitations  GI:  No heartburn, indigestion, abdominal pain, nausea, vomiting, diarrhea  Resp:  +shortness of breath at rest. +productive cough, No coughing up of blood + wheezing Skin:  no rash or lesions.  Musculoskeletal:  No joint pain or swelling.   Remainder ROS performed and is unremarkable other than noted in HPI  Past Medical History:  Diagnosis Date  . COPD (chronic obstructive pulmonary disease) ([place])    History reviewed. No pertinent surgical  history. Social History:  reports that she quit smoking about 5 weeks ago. Her smoking use included Cigarettes. She has a 135.00 pack-year smoking history. She has never used smokeless tobacco. She reports that she does not drink alcohol or use drugs.  No Known Allergies  Family History  Problem Relation Age of Onset  . Asthma Mother   . COPD Father     Prior to Admission medications   Medication Sig Start Date End Date Taking? Authorizing Provider  albuterol (PROVENTIL) (2.5 MG/3ML) 0.083% nebulizer solution Take 3 mLs (2.5 mg total) by nebulization every 4 (four) hours. 08/25/16  Yes Theodoro Grist, MD  Aspirin-Acetaminophen-Caffeine (GOODY HEADACHE PO) Take 1 packet by mouth as needed (headache).   Yes Historical Provider, MD  budesonide-formoterol (SYMBICORT) 160-4.5 MCG/ACT inhaler Inhale 2 puffs into the lungs 2 (two) times daily. 08/25/16  Yes Theodoro Grist, MD  guaiFENesin (MUCINEX) 600 MG 12 hr tablet Take 1 tablet (600 mg total) by mouth 2 (two) times daily. 08/25/16  Yes Theodoro Grist, MD  ipratropium (ATROVENT HFA) 17 MCG/ACT inhaler Inhale 2 puffs into the lungs every 6 (six) hours as needed for wheezing.   Yes Historical Provider, MD  pseudoephedrine (SUDAFED) 30 MG tablet Take 30 mg by mouth every 4 (four) hours as needed for congestion.   Yes Historical Provider, MD  amoxicillin-clavulanate (AUGMENTIN) 875-125 MG tablet Take 1 tablet by mouth every 12 (twelve) hours. Patient not taking: Reported on 09/15/2016 08/25/16   Theodoro Grist, MD  benzonatate (TESSALON) 100 MG capsule Take  1 capsule (100 mg total) by mouth 3 (three) times daily. Patient not taking: Reported on 09/15/2016 08/25/16   Theodoro Grist, MD  predniSONE (STERAPRED UNI-PAK 21 TAB) 10 MG (21) TBPK tablet Please take 6 pills in the morning on the day 1, then taper by one pill daily until finished, thank you Patient not taking: Reported on 09/15/2016 08/25/16   Theodoro Grist, MD  Vitamin D, Ergocalciferol, (DRISDOL) 50000 units  CAPS capsule Take 50,000 Units by mouth every 7 (seven) days. 08/12/16   Historical Provider, MD   Physical Exam: Vitals:   09/16/16 1943 09/16/16 2019 09/17/16 0414 09/17/16 1352  BP: (!) 155/86  128/74 (!) 144/74  Pulse: 91  71 91  Resp: 20  20 18   Temp: 97.8 F (36.6 C)  98 F (36.7 C) 97.7 F (36.5 C)  TempSrc: Oral   Oral  SpO2: 98% 95% 99% 99%  Weight:      Height:        Wt Readings from Last 3 Encounters:  09/15/16 36 kg (79 lb 4.8 oz)  08/23/16 37.2 kg (82 lb)    General:  Appears calm and comfortable Eyes: PERRL, normal lids, irises & conjunctiva ENT: grossly normal hearing, lips & tongue Neck: no LAD, masses or thyromegaly Cardiovascular: RRR, no m/r/g. No LE edema. Respiratory:   Normal respiratory effort overall diminished breath sounds with few coarse rhonchi noted Abdomen: soft, nontender Skin: no rash or induration seen on limited exam Musculoskeletal: grossly normal tone BUE/BLE Psychiatric: grossly normal mood and affect Neurologic: grossly non-focal.          Labs on Admission:  Basic Metabolic Panel:  Recent Labs Lab 09/15/16 0926 09/15/16 1337 09/16/16 0326 09/17/16 0457  NA 133*  --  134* 138  K 4.4  --  3.3* 3.9  CL 97*  --  97* 100*  CO2 25  --  28 30  GLUCOSE 103*  --  149* 128*  BUN 11  --  11 8  CREATININE 0.30*  --  <0.30* <0.30*  CALCIUM 8.3*  --  8.4* 8.8*  MG  --  1.8  --   --    Liver Function Tests:  Recent Labs Lab 09/15/16 0926  AST 22  ALT 17  ALKPHOS 105  BILITOT 0.3  PROT 6.0*  ALBUMIN 2.4*   No results for input(s): LIPASE, AMYLASE in the last 168 hours. No results for input(s): AMMONIA in the last 168 hours. CBC:  Recent Labs Lab 09/15/16 0926 09/16/16 0326  WBC 18.3* 8.7  NEUTROABS 16.2*  --   HGB 11.4* 11.0*  HCT 35.0 33.7*  MCV 82.5 83.1  PLT 703* 693*   Cardiac Enzymes:  Recent Labs Lab 09/15/16 0926 09/15/16 1631 09/16/16 0326  TROPONINI 0.08* 0.28* 0.19*    BNP (last 3  results)  Recent Labs  08/23/16 1310 09/15/16 0926  BNP 572.0* 161.0*    ProBNP (last 3 results) No results for input(s): PROBNP in the last 8760 hours.  CBG: No results for input(s): GLUCAP in the last 168 hours.  Radiological Exams on Admission: No results found.  EKG: Independently reviewed.  Assessment/Plan Active Problems:   COPD exacerbation (HCC)   1.  persisting cavitary pneumonia  she has been started on azithromycin and cefepime The cavitary nature of the infiltrate is of concern and possibility of anaerobic bacteria should be considered which are not being covered by either IV antibiotics  Wean may need to do bronchoscopy for culture purposes however again  very risky in this patient  2.  severe end-stage terminal COPD  continue with steroids Continue with nebs and inhalers as you are Prognosis poor  3.  ongoing tobacco use  smoking cessation counseling was provided  Code Status:  Full code DVT Prophylaxis: Lovenox Family Communication:  none Disposition Plan: home  Time spent: 62min    I have personally obtained a history, examined the patient, evaluated laboratory and imaging results, formulated the assessment and plan and placed orders.  The Patient requires high complexity decision making for assessment and support.    Allyne Gee, MD [facility] Pulmonary Critical Care Medicine Sleep Medicine

## 2016-09-17 NOTE — Progress Notes (Signed)
[place] [facility] [place] NAME: Samantha Ho    MR#:  [id number]  DATE OF BIRTH:  1956/03/30  SUBJECTIVE:   Patient here with SOB and cough, recently treated for cavitary pneumonia, little better now. Awaited Pulm inputs.  REVIEW OF SYSTEMS:    Review of Systems  Constitutional: Negative.  Negative for chills, fever and malaise/fatigue.  HENT: Negative.  Negative for ear discharge, ear pain, hearing loss, nosebleeds and sore throat.   Eyes: Negative.  Negative for blurred vision and pain.  Respiratory: Positive for cough and shortness of breath. Negative for hemoptysis and wheezing.   Cardiovascular: Negative.  Negative for chest pain, palpitations and leg swelling.  Gastrointestinal: Negative.  Negative for abdominal pain, blood in stool, diarrhea, nausea and vomiting.  Genitourinary: Negative.  Negative for dysuria.  Musculoskeletal: Negative.  Negative for back pain.  Skin: Negative.   Neurological: Negative for dizziness, tremors, speech change, focal weakness, seizures and headaches.  Endo/Heme/Allergies: Negative.  Does not bruise/bleed easily.  Psychiatric/Behavioral: Negative.  Negative for depression, hallucinations and suicidal ideas.    Tolerating Diet: yes  DRUG ALLERGIES:  No Known Allergies  VITALS:  Blood pressure 128/74, pulse 71, temperature 98 F (36.7 C), resp. rate 20, height 5\' 2"  (1.575 m), weight 36 kg (79 lb 4.8 oz), SpO2 99 %.  PHYSICAL EXAMINATION:   Physical Exam  Constitutional: She is oriented to person, place, and time and well-developed, well-nourished, and in no distress. No distress.  HENT:  Head: Normocephalic.  Eyes: No scleral icterus.  Neck: Normal range of motion. Neck supple. No JVD present. No tracheal deviation present.  Cardiovascular: Normal rate, regular rhythm and normal heart sounds.  Exam reveals no gallop and no friction rub.   No murmur heard. Pulmonary/Chest: Effort normal and breath  sounds normal. No respiratory distress. She has no wheezes. She has no rales. She exhibits no tenderness.  Sounds tight  Abdominal: Soft. Bowel sounds are normal. She exhibits no distension and no mass. There is no tenderness. There is no rebound and no guarding.  Musculoskeletal: Normal range of motion. She exhibits no edema.  Neurological: She is alert and oriented to person, place, and time.  Skin: Skin is warm. No rash noted. No erythema.  Psychiatric: Affect and judgment normal.    LABORATORY PANEL:   CBC  Recent Labs Lab 09/16/16 0326  WBC 8.7  HGB 11.0*  HCT 33.7*  PLT 693*   ------------------------------------------------------------------------------------------------------------------  Chemistries   Recent Labs Lab 09/15/16 0926 09/15/16 1337  09/17/16 0457  NA 133*  --   < > 138  K 4.4  --   < > 3.9  CL 97*  --   < > 100*  CO2 25  --   < > 30  GLUCOSE 103*  --   < > 128*  BUN 11  --   < > 8  CREATININE 0.30*  --   < > <0.30*  CALCIUM 8.3*  --   < > 8.8*  MG  --  1.8  --   --   AST 22  --   --   --   ALT 17  --   --   --   ALKPHOS 105  --   --   --   BILITOT 0.3  --   --   --   < > = values in this interval not displayed. ------------------------------------------------------------------------------------------------------------------  Cardiac Enzymes  Recent Labs Lab 09/15/16 [phone] 09/15/16 1631  09/16/16 0326  TROPONINI 0.08* 0.28* 0.19*   ------------------------------------------------------------------------------------------------------------------  RADIOLOGY:  Dg Chest 2 View  Result Date: 09/15/2016 CLINICAL DATA:  Shortness of Breath EXAM: CHEST  2 VIEW COMPARISON:  Chest radiograph and chest CT August 23, 2016 FINDINGS: There is extensive underlying emphysematous change. There is fibrotic type change throughout the mid and lower lung zones without appreciable change from studies 3.5 weeks earlier. Asymmetric pleural thickening noted on the  left. On the frontal view, there is an air-fluid level on the right slightly inferior to the carina, not appreciable on recent radiographic study. Heart is small consistent with underlying emphysema. Distortion of the pulmonary vascularity is stable. No adenopathy is appreciable by radiography. No bone lesions. IMPRESSION: Underlying emphysematous change with widespread cicatrization and fibrotic type change. Air-fluid level on the right medially slightly inferior to the carina level, not appreciable on recent prior studies. Question developing cavitary lesion in this area. Given an apparent change from recent prior study, correlation with noncontrast enhanced chest CT felt to be advisable. Stable cardiac silhouette. Distortion of pulmonary vascularity is stable. No appreciable adenopathy by radiography noted. Electronically Signed   By: Lowella Grip III M.D.   On: 09/15/2016 10:09   Ct Chest Wo Contrast  Result Date: 09/15/2016 CLINICAL DATA:  61 year old female with pneumonia EXAM: CT CHEST WITHOUT CONTRAST TECHNIQUE: Multidetector CT imaging of the chest was performed following the standard protocol without IV contrast. COMPARISON:  Chest x-ray 08/23/2016 and 09/15/2016. Prior contrast-enhanced CT 08/23/2016 FINDINGS: Cardiovascular: Heart size unchanged. Minimal calcifications of the thoracic aorta. Calcifications of left main, left anterior descending coronary arteries. Note that the vasculature is not well evaluated given the exclusion of contrast. Mediastinum/Nodes: Multiple small lymph nodes throughout the mediastinum. None of these are enlarged. Left axillary lymph nodes borderline enlarged, similar to the prior CT. Small right axillary lymph nodes. Small supraclavicular lymph nodes. Lungs/Pleura: Re- demonstration of advanced paraseptal and centrilobular emphysema with expansion of the lungs, flattened hemidiaphragms, increased retrosternal airspace, increased AP diameter of the chest.  Architectural distortion scarring involves all of the lung lobes, including bilateral apical pleuroparenchymal thickening and thickening of the fissures. On the right there has been progression of confluent nodular opacity involving the superior segments of the left lower lobe which are expanded. Cavitary changes involve the superior medial segments. Air-fluid level on the recent chest x-ray is enlarged compared to the prior CT, involving the medial segments. Partially resolved ground-glass and nodular opacities at the periphery of the extreme bilateral lung bases and posterior right base. Focal nodule at the periphery of the left lower lobe measures 12 mm, unchanged from prior. Re- demonstration of elongated air cavity at the posterior left lung extending from the apex inferiorly below the hilum. No vasculature are bronchi are visualized to cross the space which has a thick rim circumferentially. Upper Abdomen: Aortic atherosclerosis. Musculoskeletal: No displaced fracture.  Osteopenia. IMPRESSION: Mixed response of multifocal airspace opacities, suggesting only partial clearing of multifocal pneumonia. The bilateral lung bases are most improved, with increasing consolidation and cavitary changes involving the superior and medial segments of the right lower lobe. Increasing cavitation of the superior medial segments of the right lower lobe with air-fluid level, accounting for findings on today's chest x-ray. This is favored to represent cavitating pneumonia. Associated empyema not excluded. 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 Pleuroparenchymal thickening at the bilateral lung apices with associated  architectural distortion and thickening of the fissures and pleurae bilaterally. In addition, there is a 12 mm nodule at the left lung base. Neoplasm not excluded at any of these location  Re- demonstration of extensive emphysema bilaterally. Aortic atherosclerosis and coronary artery disease. Borderline enlarged lymph nodes at the left axillary region, potentially reactive. Electronically Signed   By: Corrie Mckusick D.O.   On: 09/15/2016 11:33     ASSESSMENT AND PLAN:   61 y/o Female recently discharged from the hospital cavitary pneumonia presents again with shortness of breath.  1. Acute on chronic respiratory failure, hypoxic due to COPD exacerbation and the Cavitary PNA: Given cefepime and vancomycin Given IV steroids- now switch to oral steroids. Wean oxygen as tolerated, patient was discharged with one and half liters oxygen Continue duo nebs and inhalers Pulmonary consultation, last hospital stay bronchoscopy was not offered due to high risk procedure  Sputum cx sent- awaited results. Will stop IV vanc as her MRSA screen was negative.  2. Hyponatremia improved  3. Hypokalemia: Replete and recheck in a.m. Improved.  4. Leukocytosis: Improved with treatment as stated above  5. Elevated troponin due to demand ischemia and not ACS    Management plans discussed with the patient and she is in agreement.  CODE STATUS: full  TOTAL TIME TAKING CARE OF THIS PATIENT: 30 minutes.     POSSIBLE D/C 2-3 days, DEPENDING ON CLINICAL CONDITION.   Vaughan Basta M.D on 09/17/2016 at 8:10 AM  Between 7am to 6pm - Pager - [phone] After 6pm go to [email or web address] - password [email or web address] [facility]  Office  [phone]  CC: Primary care physician; Pcp Not In System  Note: This dictation was prepared with Dragon dictation along with smaller phrase technology. Any transcriptional errors that result from this process are unintentional.

## 2016-09-18 MED ORDER — PIPERACILLIN-TAZOBACTAM 3.375 G IVPB
3.3750 g | Freq: Three times a day (TID) | INTRAVENOUS | Status: DC
  Administered 2016-09-18 – 2016-09-19 (×3): 3.375 g via INTRAVENOUS
  Filled 2016-09-18 (×3): qty 50

## 2016-09-18 NOTE — Progress Notes (Signed)
ANTIBIOTIC CONSULT NOTE - INITIAL  Pharmacy Consult for Zosyn Indication: HCAP  No Known Allergies  Patient Measurements: Height: 5\' 2"  (157.5 cm) Weight: 79 lb 4.8 oz (36 kg) IBW/kg (Calculated) : 50.1 Adjusted Body Weight:   Vital Signs: Temp: 97.8 F (36.6 C) (03/27 0417) BP: 135/83 (03/27 0417) Pulse Rate: 80 (03/27 0417) Intake/Output from previous day: 03/26 0701 - 03/27 0700 In: 580 [P.O.:480; IV Piggyback:100] Out: -  Intake/Output from this shift: Total I/O In: 240 [P.O.:240] Out: -   Labs:  Recent Labs  09/16/16 0326 09/17/16 0457  WBC 8.7  --   HGB 11.0*  --   PLT 693*  --   CREATININE <0.30* <0.30*   CrCl cannot be calculated (This lab value cannot be used to calculate CrCl because it is not a number: <0.30). No results for input(s): VANCOTROUGH, VANCOPEAK, VANCORANDOM, GENTTROUGH, GENTPEAK, GENTRANDOM, TOBRATROUGH, TOBRAPEAK, TOBRARND, AMIKACINPEAK, AMIKACINTROU, AMIKACIN in the last 72 hours.   Microbiology: Recent Results (from the past 720 hour(s))  Blood Culture (routine x 2)     Status: None   Collection Time: 08/23/16  1:08 PM  Result Value Ref Range Status   Specimen Description BLOOD L ARM  Final   Special Requests BOTTLES DRAWN AEROBIC AND ANAEROBIC BCAV  Final   Culture NO GROWTH 5 DAYS  Final   Report Status 08/28/2016 FINAL  Final  Urine culture     Status: None   Collection Time: 08/23/16  1:09 PM  Result Value Ref Range Status   Specimen Description URINE, RANDOM  Final   Special Requests NONE  Final   Culture   Final    NO GROWTH Performed at [facility], [place] [address], [place], [place] [address]    Report Status 08/24/2016 FINAL  Final  Blood Culture (routine x 2)     Status: None   Collection Time: 08/23/16  1:10 PM  Result Value Ref Range Status   Specimen Description BLOOD R ARM  Final   Special Requests BOTTLES DRAWN AEROBIC AND ANAEROBIC BCAV  Final   Culture NO GROWTH 5 DAYS  Final   Report Status 08/28/2016  FINAL  Final  MRSA PCR Screening     Status: None   Collection Time: 08/23/16  6:00 PM  Result Value Ref Range Status   MRSA by PCR NEGATIVE NEGATIVE Final    Comment:        The GeneXpert MRSA Assay (FDA approved for NASAL specimens only), is one component of a comprehensive MRSA colonization surveillance program. It is not intended to diagnose MRSA infection nor to guide or monitor treatment for MRSA infections.   Culture, blood (routine x 2)     Status: None (Preliminary result)   Collection Time: 09/15/16 12:15 PM  Result Value Ref Range Status   Specimen Description BLOOD LEFT ASSIST CONTROL  Final   Special Requests BOTTLES DRAWN AEROBIC AND ANAEROBIC BCAV  Final   Culture NO GROWTH 3 DAYS  Final   Report Status PENDING  Incomplete  Culture, blood (routine x 2)     Status: None (Preliminary result)   Collection Time: 09/15/16 12:15 PM  Result Value Ref Range Status   Specimen Description BLOOD LEFT ARM  Final   Special Requests BOTTLES DRAWN AEROBIC AND ANAEROBIC BCAV  Final   Culture NO GROWTH 3 DAYS  Final   Report Status PENDING  Incomplete  Culture, sputum-assessment     Status: None   Collection Time: 09/15/16  6:50 PM  Result Value Ref Range Status   Specimen Description EXPECTORATED SPUTUM  Final   Special Requests NONE  Final   Sputum evaluation THIS SPECIMEN IS ACCEPTABLE FOR SPUTUM CULTURE  Final   Report Status 09/17/2016 FINAL  Final  Culture, respiratory (NON-Expectorated)     Status: None (Preliminary result)   Collection Time: 09/15/16  6:50 PM  Result Value Ref Range Status   Specimen Description EXPECTORATED SPUTUM  Final   Special Requests NONE Reflexed from [id number]  Final   Gram Stain   Final    MODERATE WBC PRESENT, PREDOMINANTLY PMN RARE GRAM POSITIVE COCCI IN PAIRS    Culture   Final    CULTURE REINCUBATED FOR BETTER GROWTH Performed at [facility], [place] [address] [place], [place] [address]    Report Status PENDING  Incomplete   MRSA PCR Screening     Status: None   Collection Time: 09/17/16  8:32 AM  Result Value Ref Range Status   MRSA by PCR NEGATIVE NEGATIVE Final    Comment:        The GeneXpert MRSA Assay (FDA approved for NASAL specimens only), is one component of a comprehensive MRSA colonization surveillance program. It is not intended to diagnose MRSA infection nor to guide or monitor treatment for MRSA infections.     Medical History: Past Medical History:  Diagnosis Date  . COPD (chronic obstructive pulmonary disease) (HCC)     Medications:  Prescriptions Prior to Admission  Medication Sig Dispense Refill Last Dose  . albuterol (PROVENTIL) (2.5 MG/3ML) 0.083% nebulizer solution Take 3 mLs (2.5 mg total) by nebulization every 4 (four) hours. 180 vial 12 09/14/2016 at 2000  . Aspirin-Acetaminophen-Caffeine (GOODY HEADACHE PO) Take 1 packet by mouth as needed (headache).   PRN at PRN  . budesonide-formoterol (SYMBICORT) 160-4.5 MCG/ACT inhaler Inhale 2 puffs into the lungs 2 (two) times daily. 1 Inhaler 12 09/14/2016 at 2000  . guaiFENesin (MUCINEX) 600 MG 12 hr tablet Take 1 tablet (600 mg total) by mouth 2 (two) times daily. 20 tablet 0 09/14/2016 at 2000  . ipratropium (ATROVENT HFA) 17 MCG/ACT inhaler Inhale 2 puffs into the lungs every 6 (six) hours as needed for wheezing.   PRN at PRN  . pseudoephedrine (SUDAFED) 30 MG tablet Take 30 mg by mouth every 4 (four) hours as needed for congestion.   09/14/2016 at Unknown time  . amoxicillin-clavulanate (AUGMENTIN) 875-125 MG tablet Take 1 tablet by mouth every 12 (twelve) hours. (Patient not taking: Reported on 09/15/2016) 24 tablet 0 Not Taking at Unknown time  . benzonatate (TESSALON) 100 MG capsule Take 1 capsule (100 mg total) by mouth 3 (three) times daily. (Patient not taking: Reported on 09/15/2016) 20 capsule 0 Not Taking at Unknown time  . predniSONE (STERAPRED UNI-PAK 21 TAB) 10 MG (21) TBPK tablet Please take 6 pills in the morning on the  day 1, then taper by one pill daily until finished, thank you (Patient not taking: Reported on 09/15/2016) 21 tablet 0 Completed Course at Unknown time  . Vitamin D, Ergocalciferol, (DRISDOL) 50000 units CAPS capsule Take 50,000 Units by mouth every 7 (seven) days.   Not Taking at Unknown time   Scheduled:  . albuterol  2.5 mg Nebulization Q4H  . aspirin  81 mg Oral Daily  . azithromycin  250 mg Oral Daily  . benzonatate  100 mg Oral TID  . enoxaparin (LOVENOX) injection  30 mg Subcutaneous Q24H  . feeding supplement (ENSURE ENLIVE)  237 mL  Oral BID BM  . guaiFENesin  600 mg Oral BID  . mometasone-formoterol  2 puff Inhalation BID  . multivitamin with minerals  1 tablet Oral Daily  . piperacillin-tazobactam (ZOSYN)  IV  3.375 g Intravenous Q8H  . predniSONE  50 mg Oral Q breakfast   Assessment: Pharmacy consulted to dose and monitor Zosyn in this 61 year old female with history of COPD (severe)  Goal of Therapy:    Plan:  Will start Zosyn 3.375 g IV q8 hours.   Areebah Meinders D 09/18/2016,12:07 PM

## 2016-09-18 NOTE — Progress Notes (Signed)
[place] [facility] [place] NAME: Samantha Ho    MR#:  [id number]  DATE OF BIRTH:  1955/08/21  SUBJECTIVE:   Patient here with SOB and cough, recently treated for cavitary pneumonia, little better now.  Able to ambulate in room.  REVIEW OF SYSTEMS:    Review of Systems  Constitutional: Negative.  Negative for chills, fever and malaise/fatigue.  HENT: Negative.  Negative for ear discharge, ear pain, hearing loss, nosebleeds and sore throat.   Eyes: Negative.  Negative for blurred vision and pain.  Respiratory: Positive for cough and shortness of breath. Negative for hemoptysis and wheezing.   Cardiovascular: Negative.  Negative for chest pain, palpitations and leg swelling.  Gastrointestinal: Negative.  Negative for abdominal pain, blood in stool, diarrhea, nausea and vomiting.  Genitourinary: Negative.  Negative for dysuria.  Musculoskeletal: Negative.  Negative for back pain.  Skin: Negative.   Neurological: Negative for dizziness, tremors, speech change, focal weakness, seizures and headaches.  Endo/Heme/Allergies: Negative.  Does not bruise/bleed easily.  Psychiatric/Behavioral: Negative.  Negative for depression, hallucinations and suicidal ideas.    Tolerating Diet: yes  DRUG ALLERGIES:  No Known Allergies  VITALS:  Blood pressure 113/63, pulse 88, temperature 98.2 F (36.8 C), temperature source Oral, resp. rate 20, height 5\' 2"  (1.575 m), weight 36 kg (79 lb 4.8 oz), SpO2 97 %.  PHYSICAL EXAMINATION:   Physical Exam  Constitutional: She is oriented to person, place, and time and well-developed, well-nourished, and in no distress. No distress.  HENT:  Head: Normocephalic.  Eyes: No scleral icterus.  Neck: Normal range of motion. Neck supple. No JVD present. No tracheal deviation present.  Cardiovascular: Normal rate, regular rhythm and normal heart sounds.  Exam reveals no gallop and no friction rub.   No murmur  heard. Pulmonary/Chest: Effort normal and breath sounds normal. No respiratory distress. She has no wheezes. She has no rales. She exhibits no tenderness.  Abdominal: Soft. Bowel sounds are normal. She exhibits no distension and no mass. There is no tenderness. There is no rebound and no guarding.  Musculoskeletal: Normal range of motion. She exhibits no edema.  Neurological: She is alert and oriented to person, place, and time.  Skin: Skin is warm. No rash noted. No erythema.  Psychiatric: Affect and judgment normal.    LABORATORY PANEL:   CBC  Recent Labs Lab 09/16/16 0326  WBC 8.7  HGB 11.0*  HCT 33.7*  PLT 693*   ------------------------------------------------------------------------------------------------------------------  Chemistries   Recent Labs Lab 09/15/16 0926 09/15/16 1337  09/17/16 0457  NA 133*  --   < > 138  K 4.4  --   < > 3.9  CL 97*  --   < > 100*  CO2 25  --   < > 30  GLUCOSE 103*  --   < > 128*  BUN 11  --   < > 8  CREATININE 0.30*  --   < > <0.30*  CALCIUM 8.3*  --   < > 8.8*  MG  --  1.8  --   --   AST 22  --   --   --   ALT 17  --   --   --   ALKPHOS 105  --   --   --   BILITOT 0.3  --   --   --   < > = values in this interval not displayed. ------------------------------------------------------------------------------------------------------------------  Cardiac Enzymes  Recent Labs Lab 09/15/16  [id number] 09/15/16 1631 09/16/16 0326  TROPONINI 0.08* 0.28* 0.19*   ------------------------------------------------------------------------------------------------------------------  RADIOLOGY:  No results found.   ASSESSMENT AND PLAN:   61 y/o Female recently discharged from the hospital cavitary pneumonia presents again with shortness of breath.  1. Acute on chronic respiratory failure, hypoxic due to COPD exacerbation and the Cavitary PNA: Given cefepime and vancomycin Given IV steroids- now switch to oral steroids. Wean oxygen as  tolerated, patient was discharged with one and half liters oxygen Continue duo nebs and inhalers Pulmonary consultation, last hospital stay bronchoscopy was not offered due to high risk procedure  Sputum cx sent- awaited results. Will stop IV vanc as her MRSA screen was negative.  Pulm suggested to cover for anerobes due to cavitary lesion.   Change to zosyn.  2. Hyponatremia improved  3. Hypokalemia: Replete and recheck in a.m. Improved.  4. Leukocytosis: Improved with treatment as stated above  5. Elevated troponin due to demand ischemia and not ACS    Management plans discussed with the patient and she is in agreement.  CODE STATUS: full  TOTAL TIME TAKING CARE OF THIS PATIENT: 30 minutes.     POSSIBLE D/C 1-2 days, DEPENDING ON CLINICAL CONDITION.   Vaughan Basta M.D on 09/18/2016 at 8:54 PM  Between 7am to 6pm - Pager - [phone] After 6pm go to [email or web address] - password [email or web address] [facility]  Office  [phone]  CC: Primary care physician; Pcp Not In System  Note: This dictation was prepared with Dragon dictation along with smaller phrase technology. Any transcriptional errors that result from this process are unintentional.

## 2016-09-19 LAB — CULTURE, RESPIRATORY

## 2016-09-19 LAB — CULTURE, RESPIRATORY W GRAM STAIN: Culture: NORMAL

## 2016-09-19 MED ORDER — AZITHROMYCIN 250 MG PO TABS: 250.0000 mg | ORAL_TABLET | Freq: Every day | ORAL | 0 refills | Status: AC

## 2016-09-19 MED ORDER — AMOXICILLIN-POT CLAVULANATE 875-125 MG PO TABS: 1.0000 | ORAL_TABLET | Freq: Two times a day (BID) | ORAL | 0 refills | Status: AC

## 2016-09-19 MED ORDER — ENSURE ENLIVE PO LIQD: 237.0000 mL | Freq: Two times a day (BID) | ORAL | 12 refills | Status: AC

## 2016-09-19 MED ORDER — PREDNISONE 10 MG (21) PO TBPK: ORAL_TABLET | ORAL | 0 refills | Status: AC

## 2016-09-19 NOTE — Discharge Summary (Signed)
[facility] NAME: Samantha Ho    MR#:  [id number]  DATE OF BIRTH:  Mar 11, 1956  DATE OF ADMISSION:  09/15/2016 ADMITTING PHYSICIAN: Demetrios Loll, MD  DATE OF DISCHARGE: 09/19/2016  PRIMARY CARE PHYSICIAN: Pcp Not In System    ADMISSION DIAGNOSIS:  COPD exacerbation ([place]) [J44.1] Troponin I above reference range [R74.8]  DISCHARGE DIAGNOSIS:  Active Problems:   COPD exacerbation ([facility])   Cavitary pneumonia  SECONDARY DIAGNOSIS:   Past Medical History:  Diagnosis Date  . COPD (chronic obstructive pulmonary disease) ([place])     HOSPITAL COURSE:   1. Acute on chronic respiratory failure, hypoxic due to COPD exacerbation and the Cavitary PNA: Given cefepime and vancomycin Given IV steroids- now switch to oral steroids. Wean oxygen as tolerated, patient was discharged with one and half liters oxygen Continue duo nebs and inhalers Pulmonary consultation, last hospital stay bronchoscopy was not offered due to high risk procedure  Sputum cx sent- awaited results. Will stop IV vanc as her MRSA screen was negative.  Pulm suggested to cover for anerobes due to cavitary lesion.   Change to zosyn.   She is feeling better now, pulm suggested same- " no procedure due to high risk"   Will give total 14 days of Abx and advise to follow in pulm clinic.  2. Hyponatremia improved  3. Hypokalemia: Replete and recheck in a.m. Improved.  4. Leukocytosis: Improved with treatment as stated above  5. Elevated troponin due to demand ischemia and not ACS DISCHARGE CONDITIONS:   Stable.  CONSULTS OBTAINED:  Treatment Team:  Flora Lipps, MD Erby Pian, MD Vaughan Basta, MD Allyne Gee, MD  DRUG ALLERGIES:  No Known Allergies  DISCHARGE MEDICATIONS:   Current Discharge Medication List    START taking these medications   Details  azithromycin (ZITHROMAX) 250 MG tablet Take 1 tablet (250 mg total) by mouth  daily. Qty: 5 tablet, Refills: 0    feeding supplement, ENSURE ENLIVE, (ENSURE ENLIVE) LIQD Take 237 mLs by mouth 2 (two) times daily between meals. Qty: 237 mL, Refills: 12      CONTINUE these medications which have CHANGED   Details  amoxicillin-clavulanate (AUGMENTIN) 875-125 MG tablet Take 1 tablet by mouth every 12 (twelve) hours. Qty: 20 tablet, Refills: 0    predniSONE (STERAPRED UNI-PAK 21 TAB) 10 MG (21) TBPK tablet Please take 6 pills in the morning on the day 1, then taper by one pill daily until finished, thank you Qty: 21 tablet, Refills: 0      CONTINUE these medications which have NOT CHANGED   Details  albuterol (PROVENTIL) (2.5 MG/3ML) 0.083% nebulizer solution Take 3 mLs (2.5 mg total) by nebulization every 4 (four) hours. Qty: 180 vial, Refills: 12    budesonide-formoterol (SYMBICORT) 160-4.5 MCG/ACT inhaler Inhale 2 puffs into the lungs 2 (two) times daily. Qty: 1 Inhaler, Refills: 12    guaiFENesin (MUCINEX) 600 MG 12 hr tablet Take 1 tablet (600 mg total) by mouth 2 (two) times daily. Qty: 20 tablet, Refills: 0    ipratropium (ATROVENT HFA) 17 MCG/ACT inhaler Inhale 2 puffs into the lungs every 6 (six) hours as needed for wheezing.    pseudoephedrine (SUDAFED) 30 MG tablet Take 30 mg by mouth every 4 (four) hours as needed for congestion.    benzonatate (TESSALON) 100 MG capsule Take 1 capsule (100 mg total) by mouth 3 (three) times daily. Qty: 20 capsule, Refills: 0  Vitamin D, Ergocalciferol, (DRISDOL) 50000 units CAPS capsule Take 50,000 Units by mouth every 7 (seven) days.      STOP taking these medications     Aspirin-Acetaminophen-Caffeine (GOODY HEADACHE PO)          DISCHARGE INSTRUCTIONS:    Follow with Dr. Humphrey Rolls in 1-2 weeks.  If you experience worsening of your admission symptoms, develop shortness of breath, life threatening emergency, suicidal or homicidal thoughts you must seek medical attention immediately by calling 911 or  calling your MD immediately  if symptoms less severe.  You Must read complete instructions/literature along with all the possible adverse reactions/side effects for all the Medicines you take and that have been prescribed to you. Take any new Medicines after you have completely understood and accept all the possible adverse reactions/side effects.   Please note  You were cared for by a hospitalist during your hospital stay. If you have any questions about your discharge medications or the care you received while you were in the hospital after you are discharged, you can call the unit and asked to speak with the hospitalist on call if the hospitalist that took care of you is not available. Once you are discharged, your primary care physician will handle any further medical issues. Please note that NO REFILLS for any discharge medications will be authorized once you are discharged, as it is imperative that you return to your primary care physician (or establish a relationship with a primary care physician if you do not have one) for your aftercare needs so that they can reassess your need for medications and monitor your lab values.    Today   CHIEF COMPLAINT:   Chief Complaint  Patient presents with  . Shortness of Breath    HISTORY OF PRESENT ILLNESS:  Samantha Ho  is a 61 y.o. female with a known history of Recent diagnosis of COPD, emphysema, cavitary lesion of lung and pneumonia. The patient was treated for acute hypoxic respiratory failure secondary to COPD exacerbation and pneumonia. She was treated with antibiotics and steroid, discharged with home oxygen 2 L. She had very severe shortness of breath when she woke up this morning. Her oxygen level was 70s on her home oxygen. She denies any fever or chills, no chest pain no palpitation or leg swelling. She was treated with nebulizer and IV steroids in the ED. Chest x-ray and CAT scan of the chest show cavitary pneumonia.   VITAL SIGNS:   Blood pressure 119/73, pulse 67, temperature 97.9 F (36.6 C), resp. rate 20, height 5\' 2"  (1.575 m), weight 36 kg (79 lb 4.8 oz), SpO2 94 %.  I/O:   Intake/Output Summary (Last 24 hours) at 09/19/16 0828 Last data filed at 09/19/16 0603  Gross per 24 hour  Intake              700 ml  Output              402 ml  Net              298 ml    PHYSICAL EXAMINATION:   Constitutional: She is oriented to person, place, and time and well-developed, well-nourished, and in no distress. No distress.  HENT:  Head: Normocephalic.  Eyes: No scleral icterus.  Neck: Normal range of motion. Neck supple. No JVD present. No tracheal deviation present.  Cardiovascular: Normal rate, regular rhythm and normal heart sounds.  Exam reveals no gallop and no friction rub.   No murmur heard.  Pulmonary/Chest: Effort normal and breath sounds normal. No respiratory distress. She has no wheezes. She has no rales. She exhibits no tenderness.  Abdominal: Soft. Bowel sounds are normal. She exhibits no distension and no mass. There is no tenderness. There is no rebound and no guarding.  Musculoskeletal: Normal range of motion. She exhibits no edema.  Neurological: She is alert and oriented to person, place, and time.  Skin: Skin is warm. No rash noted. No erythema.  Psychiatric: Affect and judgment normal.   DATA REVIEW:   CBC  Recent Labs Lab 09/16/16 0326  WBC 8.7  HGB 11.0*  HCT 33.7*  PLT 693*    Chemistries   Recent Labs Lab 09/15/16 0926 09/15/16 1337  09/17/16 0457  NA 133*  --   < > 138  K 4.4  --   < > 3.9  CL 97*  --   < > 100*  CO2 25  --   < > 30  GLUCOSE 103*  --   < > 128*  BUN 11  --   < > 8  CREATININE 0.30*  --   < > <0.30*  CALCIUM 8.3*  --   < > 8.8*  MG  --  1.8  --   --   AST 22  --   --   --   ALT 17  --   --   --   ALKPHOS 105  --   --   --   BILITOT 0.3  --   --   --   < > = values in this interval not displayed.  Cardiac Enzymes  Recent Labs Lab 09/16/16 0326   TROPONINI 0.19*    Microbiology Results  Results for orders placed or performed during the hospital encounter of 09/15/16  Culture, blood (routine x 2)     Status: None (Preliminary result)   Collection Time: 09/15/16 12:15 PM  Result Value Ref Range Status   Specimen Description BLOOD LEFT ASSIST CONTROL  Final   Special Requests BOTTLES DRAWN AEROBIC AND ANAEROBIC BCAV  Final   Culture NO GROWTH 4 DAYS  Final   Report Status PENDING  Incomplete  Culture, blood (routine x 2)     Status: None (Preliminary result)   Collection Time: 09/15/16 12:15 PM  Result Value Ref Range Status   Specimen Description BLOOD LEFT ARM  Final   Special Requests BOTTLES DRAWN AEROBIC AND ANAEROBIC BCAV  Final   Culture NO GROWTH 4 DAYS  Final   Report Status PENDING  Incomplete  Culture, sputum-assessment     Status: None   Collection Time: 09/15/16  6:50 PM  Result Value Ref Range Status   Specimen Description EXPECTORATED SPUTUM  Final   Special Requests NONE  Final   Sputum evaluation THIS SPECIMEN IS ACCEPTABLE FOR SPUTUM CULTURE  Final   Report Status 09/17/2016 FINAL  Final  Culture, respiratory (NON-Expectorated)     Status: None (Preliminary result)   Collection Time: 09/15/16  6:50 PM  Result Value Ref Range Status   Specimen Description EXPECTORATED SPUTUM  Final   Special Requests NONE Reflexed from [id number]  Final   Gram Stain   Final    MODERATE WBC PRESENT, PREDOMINANTLY PMN RARE GRAM POSITIVE COCCI IN PAIRS    Culture   Final    CULTURE REINCUBATED FOR BETTER GROWTH Performed at [facility], [place] [address] [place], [place] [address]    Report Status PENDING  Incomplete  MRSA PCR Screening  Status: None   Collection Time: 09/17/16  8:32 AM  Result Value Ref Range Status   MRSA by PCR NEGATIVE NEGATIVE Final    Comment:        The GeneXpert MRSA Assay (FDA approved for NASAL specimens only), is one component of a comprehensive MRSA colonization surveillance  program. It is not intended to diagnose MRSA infection nor to guide or monitor treatment for MRSA infections.     RADIOLOGY:  No results found.  EKG:   Orders placed or performed during the hospital encounter of 09/15/16  . EKG 12-Lead  . EKG 12-Lead  . ED EKG  . ED EKG      Management plans discussed with the patient, family and they are in agreement.  CODE STATUS:     Code Status Orders        Start     Ordered   09/15/16 1253  Full code  Continuous     09/15/16 1253    Code Status History    Date Active Date Inactive Code Status Order ID Comments User Context   08/23/2016  4:59 PM 08/26/2016  6:34 PM Full Code [id number]  Nicholes Mango, MD Inpatient      TOTAL TIME TAKING CARE OF THIS PATIENT: 35 minutes.    Vaughan Basta M.D on 09/19/2016 at 8:28 AM  Between 7am to 6pm - Pager - [phone]  After 6pm go to [email or web address] - password [email or web address] [facility]  Office  [phone]  CC: Primary care physician; Pcp Not In System   Note: This dictation was prepared with Dragon dictation along with smaller phrase technology. Any transcriptional errors that result from this process are unintentional.

## 2016-09-20 LAB — CULTURE, BLOOD (ROUTINE X 2)
CULTURE: NO GROWTH
Culture: NO GROWTH

## 2017-05-15 IMAGING — CT CT CHEST W/O CM
2 of 3 series · 14 of 36 positions shown, 17 images · non-contrast
Comparison: Chest x-ray 08/23/2016 and 09/15/2016. Prior
contrast-enhanced CT 08/23/2016

CLINICAL DATA: 60-year-old female with pneumonia

EXAM:
CT CHEST WITHOUT CONTRAST
TECHNIQUE: Multidetector CT imaging of the chest was performed following the
standard protocol without IV contrast.

[Series 2: thorax · axial · 0.56mm/px · z∈[-744,-474]mm · 11 of 159 slices shown, 14 images]
[im 12/159  mediastinal]
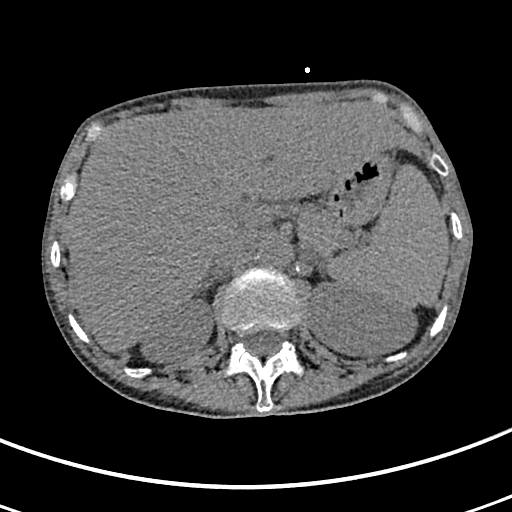
[im 12/159  lung]
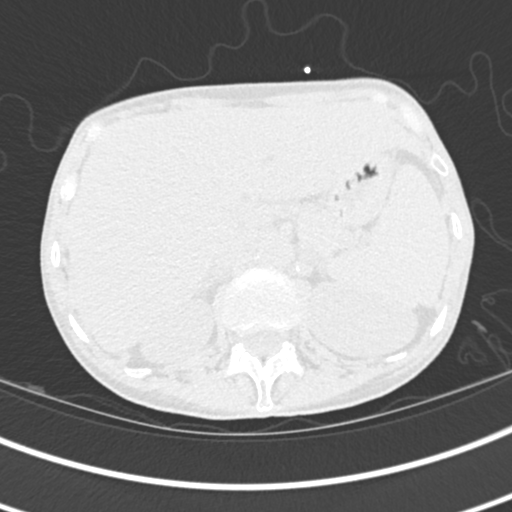
[im 24/159  lung]
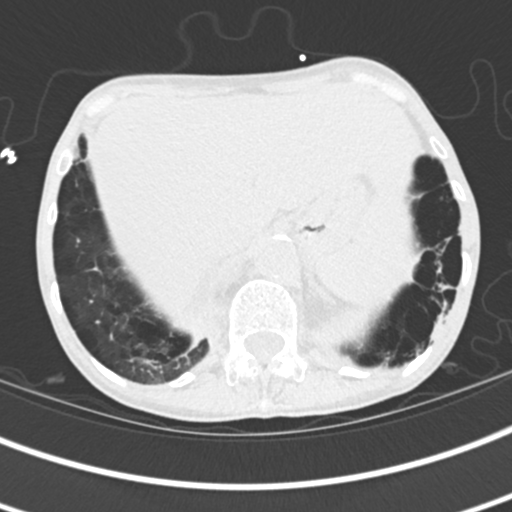
[im 36/159  lung]
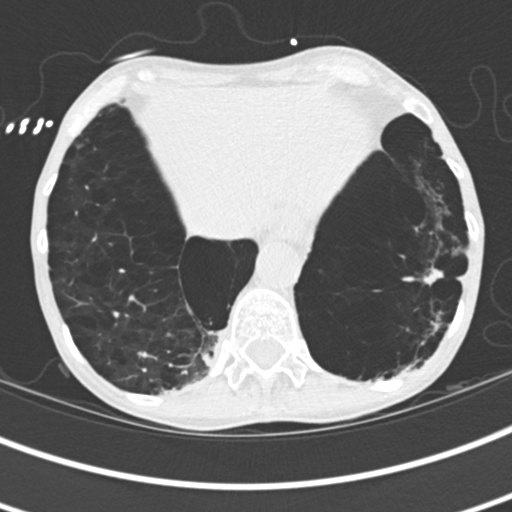
[im 53/159  lung]
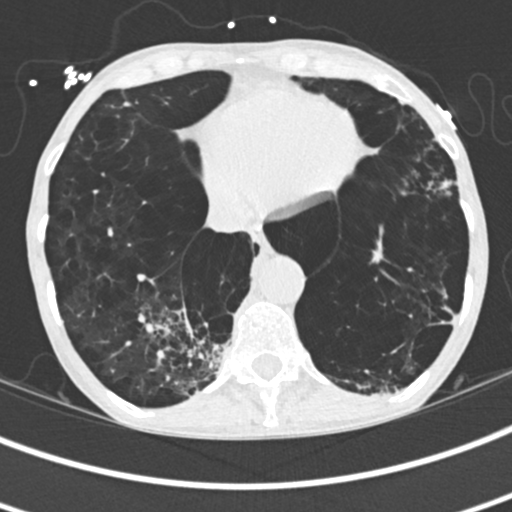
[im 65/159  mediastinal]
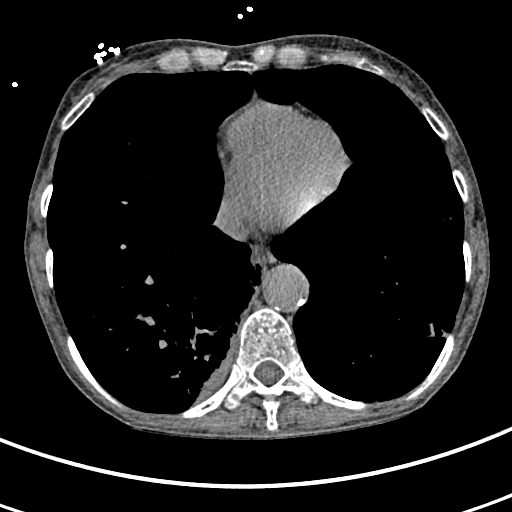
[im 65/159  lung]
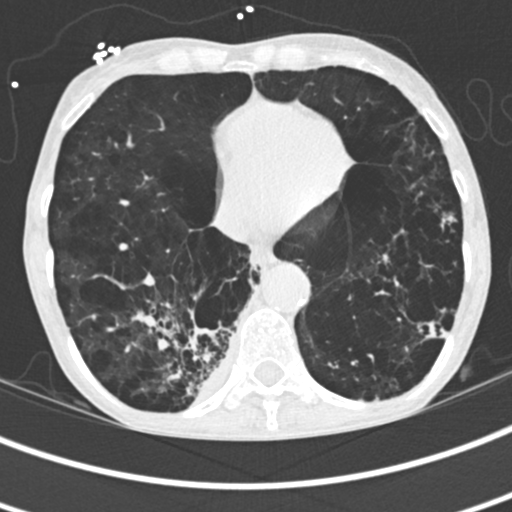
[im 82/159  lung]
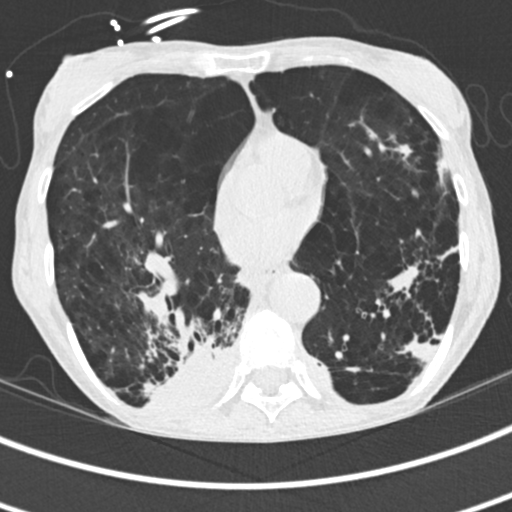
[im 94/159  lung]
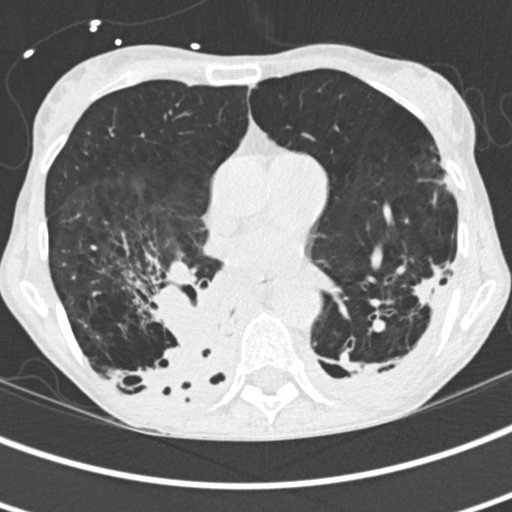
[im 106/159  lung]
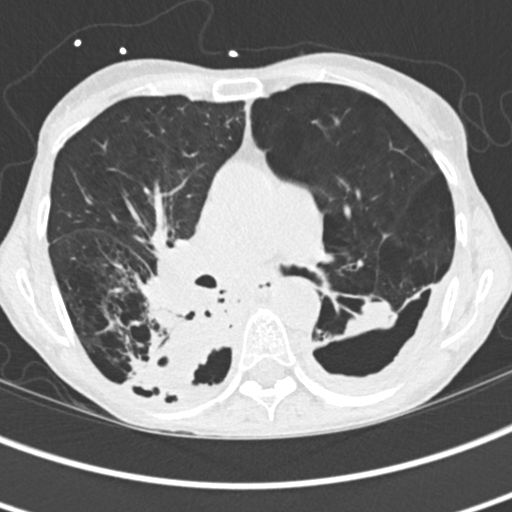
[im 123/159  mediastinal]
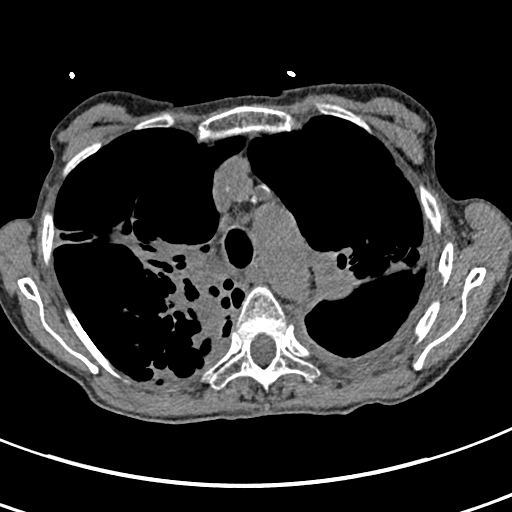
[im 123/159  lung]
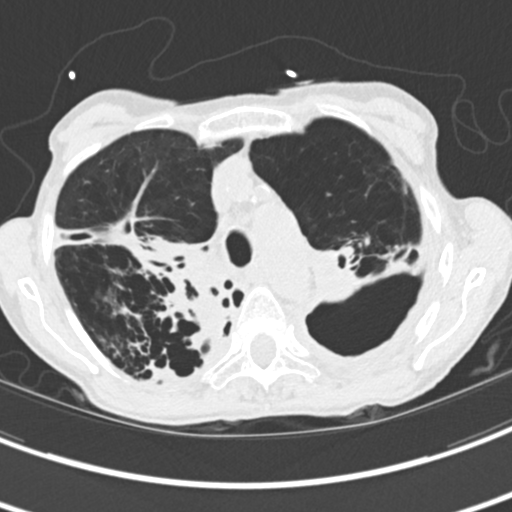
[im 135/159  lung]
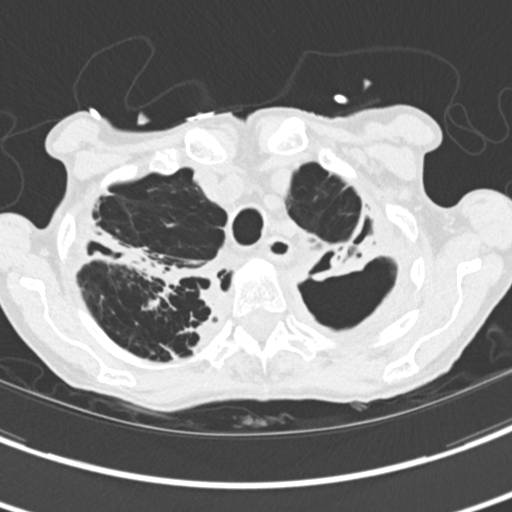
[im 147/159  lung]
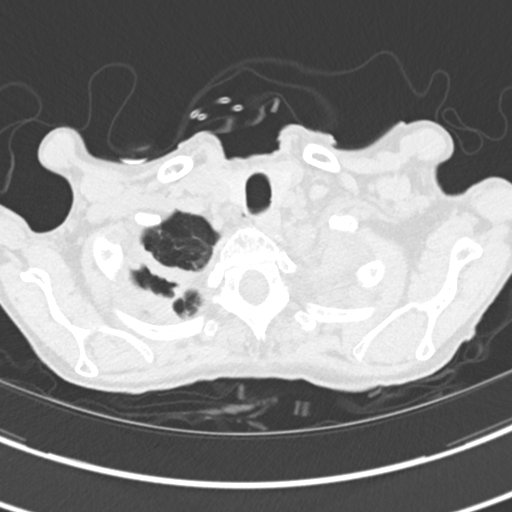

[Series 5: coronal · coronal · 0.56mm/px · 3 of 111 slices shown]
[im 23/111  lung]
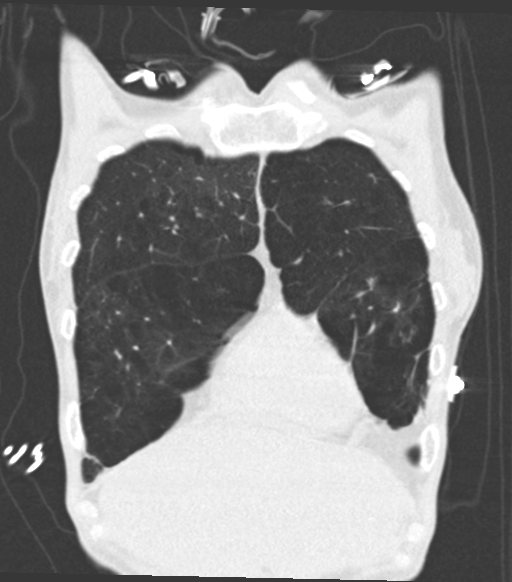
[im 45/111  lung]
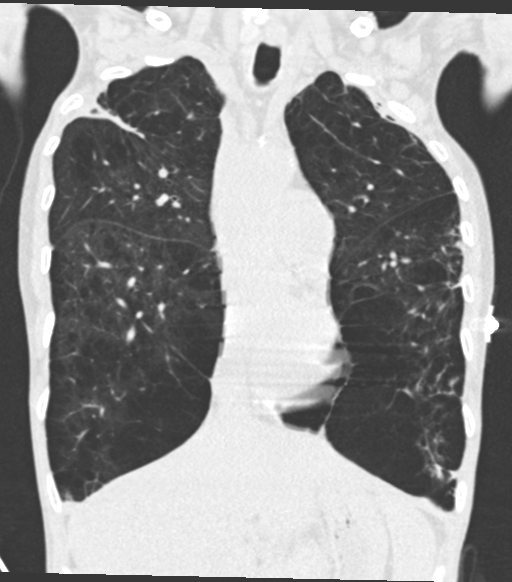
[im 67/111  lung]
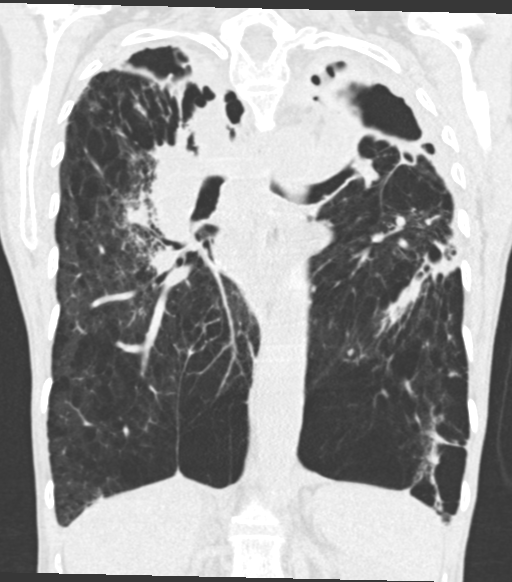

[14 of 36 positions shown; findings below may reference images not displayed]

FINDINGS: Cardiovascular: Heart size unchanged. Minimal calcifications of the
thoracic aorta. Calcifications of left main, left anterior
descending coronary arteries. Note that the vasculature is not well
evaluated given the exclusion of contrast.

Mediastinum/Nodes: Multiple small lymph nodes throughout the
mediastinum. None of these are enlarged. Left axillary lymph nodes
borderline enlarged, similar to the prior CT. Small right axillary
lymph nodes. Small supraclavicular lymph nodes.

Lungs/Pleura: Re- demonstration of advanced paraseptal and
centrilobular emphysema with expansion of the lungs, flattened
hemidiaphragms, increased retrosternal airspace, increased AP
diameter of the chest.

Architectural distortion scarring involves all of the lung lobes,
including bilateral apical pleuroparenchymal thickening and
thickening of the fissures. On the right there has been progression
of confluent nodular opacity involving the superior segments of the
left lower lobe which are expanded. Cavitary changes involve the
superior medial segments. Air-fluid level on the recent chest x-ray
is enlarged compared to the prior CT, involving the medial segments.
Partially resolved ground-glass and nodular opacities at the
periphery of the extreme bilateral lung bases and posterior right
base.

Focal nodule at the periphery of the left lower lobe measures 12 mm,
unchanged from prior.

Re- demonstration of elongated air cavity at the posterior left lung
extending from the apex inferiorly below the hilum. No vasculature
are bronchi are visualized to cross the space which has a thick rim
circumferentially.

Upper Abdomen: Aortic atherosclerosis.

Musculoskeletal: No displaced fracture.  Osteopenia.
IMPRESSION: Mixed response of multifocal airspace opacities, suggesting only
partial clearing of multifocal pneumonia. The bilateral lung bases
are most improved, with increasing consolidation and cavitary
changes involving the superior and medial segments of the right
lower lobe.

Increasing cavitation of the superior medial segments of the right
lower lobe with air-fluid level, accounting for findings on today's
chest x-ray. This is favored to represent cavitating pneumonia.
Associated empyema not excluded.

Re- demonstration of thick walled air cavity on the left, favored to
represent chronic pneumothorax secondary to bronchopleural fistula
as was suggested on previous CT.

Pleuroparenchymal thickening at the bilateral lung apices with
associated architectural distortion and thickening of the fissures
and pleurae bilaterally. In addition, there is a 12 mm nodule at the
left lung base. Neoplasm not excluded at any of these location

Re- demonstration of extensive emphysema bilaterally.

Aortic atherosclerosis and coronary artery disease.

Borderline enlarged lymph nodes at the left axillary region,
potentially reactive.

## 2017-12-23 DEATH — deceased
